# Patient Record
Sex: Female | Born: 1935 | Race: White | Hispanic: No | Marital: Married | Smoking: Never smoker
Health system: Southern US, Community
[De-identification: ages and names within clinical notes are randomized; demographics above are authoritative.]

## PROBLEM LIST (undated history)

## (undated) DIAGNOSIS — E039 Hypothyroidism, unspecified: Secondary | ICD-10-CM

## (undated) DIAGNOSIS — I10 Essential (primary) hypertension: Secondary | ICD-10-CM

## (undated) DIAGNOSIS — G4730 Sleep apnea, unspecified: Secondary | ICD-10-CM

## (undated) DIAGNOSIS — I4510 Unspecified right bundle-branch block: Secondary | ICD-10-CM

## (undated) DIAGNOSIS — M1990 Unspecified osteoarthritis, unspecified site: Secondary | ICD-10-CM

## (undated) DIAGNOSIS — Z955 Presence of coronary angioplasty implant and graft: Secondary | ICD-10-CM

## (undated) DIAGNOSIS — R0600 Dyspnea, unspecified: Secondary | ICD-10-CM

## (undated) DIAGNOSIS — I420 Dilated cardiomyopathy: Secondary | ICD-10-CM

## (undated) DIAGNOSIS — J90 Pleural effusion, not elsewhere classified: Secondary | ICD-10-CM

## (undated) DIAGNOSIS — R5383 Other fatigue: Secondary | ICD-10-CM

## (undated) DIAGNOSIS — I214 Non-ST elevation (NSTEMI) myocardial infarction: Secondary | ICD-10-CM

## (undated) DIAGNOSIS — I255 Ischemic cardiomyopathy: Secondary | ICD-10-CM

## (undated) DIAGNOSIS — I509 Heart failure, unspecified: Secondary | ICD-10-CM

## (undated) DIAGNOSIS — M797 Fibromyalgia: Secondary | ICD-10-CM

## (undated) DIAGNOSIS — R000 Tachycardia, unspecified: Secondary | ICD-10-CM

## (undated) DIAGNOSIS — R911 Solitary pulmonary nodule: Secondary | ICD-10-CM

## (undated) DIAGNOSIS — I2510 Atherosclerotic heart disease of native coronary artery without angina pectoris: Secondary | ICD-10-CM

## (undated) HISTORY — PX: APPENDECTOMY: SHX54

## (undated) HISTORY — DX: Other fatigue: R53.83

## (undated) HISTORY — DX: Heart failure, unspecified: I50.9

## (undated) HISTORY — PX: TONSILLECTOMY: SUR1361

## (undated) HISTORY — DX: Unspecified osteoarthritis, unspecified site: M19.90

## (undated) HISTORY — DX: Tachycardia, unspecified: R00.0

## (undated) HISTORY — DX: Dilated cardiomyopathy: I25.5

## (undated) HISTORY — DX: Pleural effusion, not elsewhere classified: J90

## (undated) HISTORY — DX: Hypothyroidism, unspecified: E03.9

## (undated) HISTORY — PX: OTHER SURGICAL HISTORY: SHX169

## (undated) HISTORY — DX: Non-ST elevation (NSTEMI) myocardial infarction: I21.4

## (undated) HISTORY — DX: Dyspnea, unspecified: R06.00

## (undated) HISTORY — DX: Solitary pulmonary nodule: R91.1

## (undated) HISTORY — DX: Atherosclerotic heart disease of native coronary artery without angina pectoris: I25.10

## (undated) HISTORY — DX: Essential (primary) hypertension: I10

## (undated) HISTORY — DX: Dilated cardiomyopathy: I42.0

## (undated) HISTORY — DX: Unspecified right bundle-branch block: I45.10

## (undated) HISTORY — DX: Sleep apnea, unspecified: G47.30

## (undated) HISTORY — DX: Presence of coronary angioplasty implant and graft: Z95.5

## (undated) HISTORY — DX: Fibromyalgia: M79.7

---

## 1997-11-11 ENCOUNTER — Other Ambulatory Visit: Admission: RE | Admit: 1997-11-11 | Discharge: 1997-11-11 | Payer: Self-pay | Admitting: Obstetrics & Gynecology

## 1999-11-15 ENCOUNTER — Other Ambulatory Visit: Admission: RE | Admit: 1999-11-15 | Discharge: 1999-11-15 | Payer: Self-pay | Admitting: Obstetrics & Gynecology

## 2000-12-10 ENCOUNTER — Other Ambulatory Visit: Admission: RE | Admit: 2000-12-10 | Discharge: 2000-12-10 | Payer: Self-pay | Admitting: Obstetrics & Gynecology

## 2003-12-10 ENCOUNTER — Other Ambulatory Visit: Admission: RE | Admit: 2003-12-10 | Discharge: 2003-12-10 | Payer: Self-pay | Admitting: Obstetrics & Gynecology

## 2003-12-21 ENCOUNTER — Ambulatory Visit (HOSPITAL_BASED_OUTPATIENT_CLINIC_OR_DEPARTMENT_OTHER): Admission: RE | Admit: 2003-12-21 | Discharge: 2003-12-21 | Payer: Self-pay | Admitting: Obstetrics & Gynecology

## 2004-01-06 ENCOUNTER — Ambulatory Visit: Payer: Self-pay | Admitting: Critical Care Medicine

## 2004-02-02 ENCOUNTER — Ambulatory Visit: Payer: Self-pay | Admitting: Critical Care Medicine

## 2004-04-12 ENCOUNTER — Encounter (INDEPENDENT_AMBULATORY_CARE_PROVIDER_SITE_OTHER): Payer: Self-pay | Admitting: *Deleted

## 2004-04-12 ENCOUNTER — Ambulatory Visit (HOSPITAL_COMMUNITY): Admission: RE | Admit: 2004-04-12 | Discharge: 2004-04-12 | Payer: Self-pay | Admitting: Gastroenterology

## 2004-05-23 ENCOUNTER — Ambulatory Visit: Payer: Self-pay | Admitting: Critical Care Medicine

## 2004-12-14 ENCOUNTER — Ambulatory Visit: Payer: Self-pay | Admitting: Critical Care Medicine

## 2005-10-31 ENCOUNTER — Ambulatory Visit: Payer: Self-pay | Admitting: Internal Medicine

## 2006-04-24 ENCOUNTER — Ambulatory Visit: Payer: Self-pay | Admitting: Internal Medicine

## 2006-04-26 ENCOUNTER — Encounter: Admission: RE | Admit: 2006-04-26 | Discharge: 2006-04-26 | Payer: Self-pay | Admitting: Obstetrics & Gynecology

## 2006-05-20 ENCOUNTER — Ambulatory Visit: Payer: Self-pay | Admitting: Internal Medicine

## 2006-12-11 ENCOUNTER — Ambulatory Visit: Payer: Self-pay | Admitting: Internal Medicine

## 2007-03-12 DIAGNOSIS — R911 Solitary pulmonary nodule: Secondary | ICD-10-CM

## 2007-03-12 DIAGNOSIS — J309 Allergic rhinitis, unspecified: Secondary | ICD-10-CM | POA: Insufficient documentation

## 2007-03-12 DIAGNOSIS — G4733 Obstructive sleep apnea (adult) (pediatric): Secondary | ICD-10-CM | POA: Insufficient documentation

## 2007-03-12 DIAGNOSIS — G2581 Restless legs syndrome: Secondary | ICD-10-CM

## 2007-03-12 DIAGNOSIS — IMO0001 Reserved for inherently not codable concepts without codable children: Secondary | ICD-10-CM

## 2007-03-12 DIAGNOSIS — E039 Hypothyroidism, unspecified: Secondary | ICD-10-CM | POA: Insufficient documentation

## 2007-03-14 ENCOUNTER — Encounter: Payer: Self-pay | Admitting: Internal Medicine

## 2007-04-30 ENCOUNTER — Ambulatory Visit: Payer: Self-pay | Admitting: Internal Medicine

## 2007-05-14 ENCOUNTER — Telehealth: Payer: Self-pay | Admitting: Internal Medicine

## 2008-05-04 ENCOUNTER — Ambulatory Visit: Payer: Self-pay | Admitting: Internal Medicine

## 2009-02-17 ENCOUNTER — Encounter: Payer: Self-pay | Admitting: Critical Care Medicine

## 2009-06-13 ENCOUNTER — Encounter: Payer: Self-pay | Admitting: Internal Medicine

## 2009-06-17 ENCOUNTER — Ambulatory Visit: Payer: Self-pay | Admitting: Internal Medicine

## 2009-06-21 ENCOUNTER — Telehealth (INDEPENDENT_AMBULATORY_CARE_PROVIDER_SITE_OTHER): Payer: Self-pay | Admitting: *Deleted

## 2009-06-21 LAB — CONVERTED CEMR LAB: TSH: 1.76 microintl units/mL (ref 0.35–5.50)

## 2009-06-22 ENCOUNTER — Encounter: Payer: Self-pay | Admitting: Internal Medicine

## 2009-06-23 ENCOUNTER — Telehealth: Payer: Self-pay | Admitting: Internal Medicine

## 2009-07-21 ENCOUNTER — Encounter: Payer: Self-pay | Admitting: Internal Medicine

## 2009-07-26 ENCOUNTER — Ambulatory Visit: Payer: Self-pay | Admitting: Internal Medicine

## 2009-08-25 ENCOUNTER — Encounter: Payer: Self-pay | Admitting: Internal Medicine

## 2009-12-09 ENCOUNTER — Ambulatory Visit: Payer: Self-pay | Admitting: Internal Medicine

## 2009-12-09 DIAGNOSIS — R002 Palpitations: Secondary | ICD-10-CM | POA: Insufficient documentation

## 2010-03-07 NOTE — Progress Notes (Signed)
Summary: lab results  Phone Note Call from Patient Call back at [phone]   Caller: Patient Call For: young Summary of Call: Wants lab results. Initial call taken by: Darletta Moll,  Jun 21, 2009 9:01 AM  Follow-up for Phone Call        Spoke with pt.  Informed her of lab results as stated in append from 06/19/09 by CY.  She verbalized understanding.  Gweneth Dimitri RN  Jun 21, 2009 9:27 AM

## 2010-03-07 NOTE — Assessment & Plan Note (Signed)
Summary: f/u ///kp   Primary Provider/Referring Provider:  Merlene Laughter  CC:  Pt c/o not being able to catch breath at night, current pressure on CPAP 12, and productive cough with thick yellow mucus.  History of Present Illness: Jun 17, 2009- OSA, hx lung nodule (Dr Pete Glatter follows CXR) She reports lung nodule remains stable on cxr f/u by Dr Pete Glatter. She says for several months she has been waking feeling smothered by CPAP that doesn't seem to work as well. She experiences this as the smothering she associates with the building where her mother was cremated- obviously negative connotation. Also more aware of palpitation especially on waking in the AM. We discussed hx of hypothyroid- not treated now.  July 26, 2009- OSA, hx lung nodule/ Dr Gaspar Bidding We increased CPAP to 11. Since then she has had humidifer and transformer changed. Full face mask. Dry mouth, some leak. She still feels she would like a little more pressure. Dislikes dry mouth. Asked about her lung nodule. Sometimes her lower anterior rib cage is sore. She is overall pleased to have CPAP and using it every night.  December 09, 2009- OSA, hx lung nodule/ Dr Gaspar Bidding Nurse-CC: Pt c/o not being able to catch breath at night, current pressure on CPAP 12, productive cough with thick yellow mucus She starts by saying " I am great".. then admits 2 episodes since August when she has gotten chilled, then sore throat, then persistent cough for a few weeks. First was 2-3 weeks ago. Then again in last 2 days the second episode is now associated with some thick yellow sputum. She feels she is getting over this w/o antibiotic.  Has had no problem with CPAP at 12. About twice/ week for some months she has awakened smothered, will take a deep breath and go  back to sleep. She sometimes senses CPAP flow might be not quite enough. Has felt palpitation at times.   Preventive Screening-Counseling & Management  Alcohol-Tobacco     Smoking  Status: never  Allergies: 1)  ! * Latex  Past History:  Past Medical History: Last updated: 05/19/2008 Allergic Rhinitis Sleep Apnea fibromyalgia hypothyroid hx of lung nodule- LLL Degenerative arthritis  Past Surgical History: Last updated: 2008-05-19 Tonsillectomy Appendectomy T A [facility] and B S [facility]  Family History: Last updated: 2008/05/19 Mother - died ovarian cnacer Father- died old age  Social History: Last updated: 05/19/08 Patient never smoked.  Worked Research officer, political party.  Risk Factors: Smoking Status: never (12/09/2009)  Review of Systems      See HPI       The patient complains of shortness of breath at rest and irregular heartbeats.  The patient denies shortness of breath with activity, productive cough, non-productive cough, coughing up blood, chest pain, acid heartburn, indigestion, loss of appetite, weight change, abdominal pain, difficulty swallowing, sore throat, tooth/dental problems, headaches, nasal congestion/difficulty breathing through nose, and sneezing.    Vital Signs:  Patient profile:   75 year old female Height:      66.5 inches Weight:      164.2 pounds BMI:     26.20 O2 Sat:      97 % on Room air Pulse rate:   83 / minute BP sitting:   114 / 80  (right arm) Cuff size:   regular  Vitals Entered By: Zackery Barefoot CMA (December 09, 2009 10:24 AM)  O2 Flow:  Room air CC: Pt c/o not being able to catch breath at night, current pressure on  CPAP 12, productive cough with thick yellow mucus Comments Medications reviewed with patient Verified contact number and pharmacy with patient Zackery Barefoot CMA  December 09, 2009 10:25 AM    Physical Exam  Additional Exam:  General: A/Ox3; pleasant and cooperative, NAD, wdwn, calm SKIN: no rash, lesions NODES: no lymphadenopathy HEENT: [place]/AT, EOM- WNL, Conjuctivae- clear, PERRLA, TM-WNL, Nose- clear, Throat- clear and wnl, some missing teeth, Mallampati  II-III NECK: Supple w/ fair ROM, JVD-  none, normal carotid impulses w/o bruits Thyroid- normal to palpation CHEST: Clear to P&A HEART: RRR, no m/g/r heard ABDOMEN: soft w/o HSM ZOX:WRUE, nl pulses, no edema  NEURO: Grossly intact to observation, no tremor      Impression & Recommendations:  Problem # 1:  SLEEP APNEA, OBSTRUCTIVE (ICD-327.23)  We are going to try moving her CPAP pressure up one step to 13 to see if that impacts her occasional sense of night time dyspnea. Her description is nonspecific but these episodes don't seem obviously related to her occasional palpitation, to fluid retention, wheeze or reflux that she has recognized.  She declines flu vax.   Problem # 2:  PALPITATIONS (ICD-785.1) Noted. Hx suggests this is chronic and not progressive.   Medications Added to Medication List This Visit: 1)  Cpap 13 [facility]  2)  Aspirin 81 Mg Tabs (Aspirin) .... Take 1 tablet by mouth once a day as needed  Other Orders: Est. Patient Level III (45409) DME Referral (DME)  Patient Instructions: 1)  Please schedule a follow-up appointment in 1 year. 2)  We will contact [facility] to move your pressure up to 13. If that isn't comfortable please call.  3)  If you keep having short of breath spells let us know.    Not Administered:    Pneumonia Vaccine not given due to: declined    Influenza Vaccine not given due to: declined

## 2010-03-07 NOTE — Miscellaneous (Signed)
Summary: CPAP Bi-Level Set Up Info/[facility]  CPAP Bi-Level Set Up Info/[facility]   Imported By: Sherian Rein 07/01/2009 13:31:09  _____________________________________________________________________  External Attachment:    Type:   Image     Comment:   External Document

## 2010-03-07 NOTE — Assessment & Plan Note (Signed)
Summary: 12 months/apc   Primary Provider/Referring Provider:  Merlene Laughter  CC:  Yearly follow up visit-OSA and allergies. Unsure if CPAP machine is working properly(over 75 years old);waking up at night and having suffication feelings..  History of Present Illness:  04/30/07- OSA, hx lung nodule She says Dr. Pete Glatter continues to follow her left lower lobe nodule with his last chest x-ray being done in November.  She still keeps CPAP set at 10  CWP every night.  Sometimes she feels palpitations at night, and she is going to talk to Dr. Pete Glatter about that.  She sees him today as scheduled.  She says her breathing feels shallow and, sometimes she'll cough but she doesn't notice wheeze, and she doesn't produce sputum.  There is little sinus drainage.  She is aware of reflux.  She takes naps sometimes being awakened by pain.  We talked about whether to increase her CPAP pressure little bit for trial.  Pulmonary function testing today shows high normal scores with some improvement in small airway flows after bronchodilator.  The FEV1/ FVC was 79%.  May 22, 2008- OSA, hx lung nodule ( Dr Pete Glatter follows CXR) Sleep- Waking more at night. Cites stress but also wakes dry mouth. Denies daytime sleepiness. Will sit and relax with eyes closed for a few minutes. If she naps without cpap she will wake snoring. Full face mask. Discussed mask fit.  Occasional  dry cough with cpap on.  Jun 17, 2009- OSA, hx lung nodule (Dr Pete Glatter follows CXR) She reports lung nodule remains stable on cxr f/u by Dr Pete Glatter. She says for several months she has been waking feeling smothered by CPAP that doesn't seem to work as well. She experiences this as the smothering she associates with the building where her mother was cremated- obviously negative connotation. Also more aware of palpitation especially on waking in the AM. We discussed hx of hypothyroid- not treated now.     Current Medications (verified): 1)  Vitamin  E 600 Unit  Caps (Vitamin E) .... Take 1 Capsule By Mouth Once A Day 2)  Calcium Carbonate-Vitamin D 600-400 Mg-Unit  Tabs (Calcium Carbonate-Vitamin D) .... Take 1 Tablet By Mouth Once A Day 3)  Oxytrol 3.9 Mg/24hr  Pttw (Oxybutynin) .... Use As Directed 4)  Cpap .... At 10 Cwp 5)  Vitamin D 2000 Unit Tabs (Cholecalciferol) .... Take 1 By Mouth Once Daily 6)  Fish Oil 1000 Mg Caps (Omega-3 Fatty Acids) .... Take 1 By Mouth Once Daily 7)  Garlic 1000 Mg Caps (Garlic) .... Take As Directed 8)  Red Yeast Rice 600 Mg Caps (Red Yeast Rice Extract) .... Take As Directed 9)  Flax Seed Oil 1000 Mg Caps (Flaxseed (Linseed)) .... Take As Directed 10)  Glucosamine Sulfate 1000 Mg Caps (Glucosamine Sulfate) .... Take 1 By Mouth Once Daily  Allergies (verified): 1)  ! * Latex  Past History:  Past Medical History: Last updated: 05/22/2008 Allergic Rhinitis Sleep Apnea fibromyalgia hypothyroid hx of lung nodule- LLL Degenerative arthritis  Past Surgical History: Last updated: 05-22-08 Tonsillectomy Appendectomy T A H and B S O  Family History: Last updated: May 22, 2008 Mother - died ovarian cnacer Father- died old age  Social History: Last updated: 05-22-2008 Patient never smoked.  Worked Research officer, political party.  Risk Factors: Smoking Status: never (04/30/2007)  Review of Systems      See HPI       The patient complains of irregular heartbeats.  The patient denies shortness of breath with activity, shortness of  breath at rest, productive cough, non-productive cough, coughing up blood, chest pain, acid heartburn, indigestion, loss of appetite, weight change, abdominal pain, difficulty swallowing, sore throat, tooth/dental problems, headaches, nasal congestion/difficulty breathing through nose, and sneezing.    Vital Signs:  Patient profile:   75 year old female Height:      66.5 inches Weight:      157 pounds BMI:     25.05 O2 Sat:      98 % on Room air Pulse rate:   81 / minute BP  sitting:   130 / 74  (left arm) Cuff size:   regular  Vitals Entered By: Reynaldo Minium CMA (Jun 17, 2009 11:07 AM)  O2 Flow:  Room air  Physical Exam  Additional Exam:  General: A/Ox3; pleasant and cooperative, NAD, wdwn, calm SKIN: no rash, lesions NODES: no lymphadenopathy HEENT: [place]/AT, EOM- WNL, Conjuctivae- clear, PERRLA, TM-WNL, Nose- clear, Throat- clear and wnl, some missing teeth, Mallampati  II-III NECK: Supple w/ fair ROM, JVD- none, normal carotid impulses w/o bruits Thyroid- normal to palpation CHEST: Clear to P&A HEART: RRR, no m/g/r heard ABDOMEN:  OAC:ZYSA, nl pulses, no edema  NEURO: Grossly intact to observation, no tremor      Impression & Recommendations:  Problem # 1:  SLEEP APNEA, OBSTRUCTIVE (ICD-327.23)  Her CPAP is wearing out and we will replace it at the same pressure initially. Compliance has been good.  Problem # 2:  HYPOTHYROIDISM (ICD-244.9)  Thyroid function may be relevant to her sleep. Pending return to her primary doctor in the Fall, we will check TFT.  Medications Added to Medication List This Visit: 1)  Cpap [facility]  .... At 10 cwp 2)  Glucosamine Sulfate 1000 Mg Caps (Glucosamine sulfate) .... Take 1 by mouth once daily  Other Orders: TLB-TSH (Thyroid Stimulating Hormone) (84443-TSH) Est. Patient Level III (63016) DME Referral (DME)  Patient Instructions: 1)  Please schedule a follow-up appointment in 1 year. 2)  See [facility] about replacing CPAP machine. We will leave the pressure at 10. If it isn't comfortable, then call and let me know. 3)  Lab to check on your thyroid. 4)  Patient: Tiffany Collier 5)  Note: All result statuses are Final unless otherwise noted........ 6)  Tests: (1) TSH (TSH) 7)    FastTSH                   1.76 uIU/mL                 0.35-5.50 8)  Note: An exclamation mark (!) indicates a result that was not dispersed into the flowsheet. 9)  Document Creation Date: 06/17/2009 1:53 PM 10)   _______________________________________________________________________ 11)  Keep your appointment with Dr Pete Glatter.

## 2010-03-07 NOTE — Assessment & Plan Note (Signed)
Summary: rov//mbw   Primary Provider/Referring Provider:  Merlene Laughter  CC:  rov followup from cpap.  History of Present Illness: 05/13/08- OSA, hx lung nodule ( Dr Pete Glatter follows CXR) Sleep- Waking more at night. Cites stress but also wakes dry mouth. Denies daytime sleepiness. Will sit and relax with eyes closed for a few minutes. If she naps without cpap she will wake snoring. Full face mask. Discussed mask fit.  Occasional  dry cough with cpap on.  Jun 17, 2009- OSA, hx lung nodule (Dr Pete Glatter follows CXR) She reports lung nodule remains stable on cxr f/u by Dr Pete Glatter. She says for several months she has been waking feeling smothered by CPAP that doesn't seem to work as well. She experiences this as the smothering she associates with the building where her mother was cremated- obviously negative connotation. Also more aware of palpitation especially on waking in the AM. We discussed hx of hypothyroid- not treated now.  July 26, 2009- OSA, hx lung nodule/ Dr Gaspar Bidding We increased CPAP to 11. Since then she has had humidifer and transformer changed. Full face mask. Dry mouth, some leak. She still feels she would like a little more pressure. Dislikes dry mouth. Asked about her lung nodule. Sometimes her lower anterior rib cage is sore. She is overall pleased to have CPAP and using it every night.     Preventive Screening-Counseling & Management  Alcohol-Tobacco     Smoking Status: never  Current Medications (verified): 1)  Vitamin E 600 Unit  Caps (Vitamin E) .... Take 1 Capsule By Mouth Once A Day 2)  Calcium Carbonate-Vitamin D 600-400 Mg-Unit  Tabs (Calcium Carbonate-Vitamin D) .... Take 1 Tablet By Mouth Once A Day 3)  Oxytrol 3.9 Mg/24hr  Pttw (Oxybutynin) .... Use As Directed 4)  Cpap 11 Adavnced 5)  Vitamin D 2000 Unit Tabs (Cholecalciferol) .... Take 1 By Mouth Once Daily 6)  Fish Oil 1000 Mg Caps (Omega-3 Fatty Acids) .... Take 1 By Mouth Once Daily 7)  Garlic  1000 Mg Caps (Garlic) .... Take As Directed 8)  Red Yeast Rice 600 Mg Caps (Red Yeast Rice Extract) .... Take As Directed 9)  Glucosamine Sulfate 1000 Mg Caps (Glucosamine Sulfate) .... Take 2 By Mouth Once Daily  Allergies: 1)  ! * Latex  Past History:  Past Medical History: Last updated: 05/13/2008 Allergic Rhinitis Sleep Apnea fibromyalgia hypothyroid hx of lung nodule- LLL Degenerative arthritis  Past Surgical History: Last updated: May 13, 2008 Tonsillectomy Appendectomy T A H and B S O  Family History: Last updated: 2008-05-13 Mother - died ovarian cnacer Father- died old age  Social History: Last updated: 05-13-08 Patient never smoked.  Worked Research officer, political party.  Risk Factors: Smoking Status: never (07/26/2009)  Review of Systems      See HPI  The patient denies shortness of breath with activity, shortness of breath at rest, productive cough, non-productive cough, coughing up blood, chest pain, irregular heartbeats, acid heartburn, indigestion, loss of appetite, weight change, abdominal pain, difficulty swallowing, sore throat, tooth/dental problems, headaches, nasal congestion/difficulty breathing through nose, and sneezing.    Vital Signs:  Patient profile:   75 year old female Height:      66.5 inches Weight:      157 pounds BMI:     25.05 O2 Sat:      98 % on Room air Pulse rate:   83 / minute BP sitting:   128 / 76  (left arm) Cuff size:   regular  Vitals Entered  By: Kandice Hams (July 26, 2009 1:46 PM)  O2 Flow:  Room air  Physical Exam  Additional Exam:  General: A/Ox3; pleasant and cooperative, NAD, wdwn, calm SKIN: no rash, lesions NODES: no lymphadenopathy HEENT: [place]/AT, EOM- WNL, Conjuctivae- clear, PERRLA, TM-WNL, Nose- clear, Throat- clear and wnl, some missing teeth, Mallampati  II-III NECK: Supple w/ fair ROM, JVD- none, normal carotid impulses w/o bruits Thyroid- normal to palpation CHEST: Clear to P&A HEART: RRR, no m/g/r  heard ABDOMEN: soft w/o HSM UXL:KGMW, nl pulses, no edema  NEURO: Grossly intact to observation, no tremor      Impression & Recommendations:  Problem # 1:  SLEEP APNEA, OBSTRUCTIVE (ICD-327.23)  She is compliant with CPAP and happy to have it, but still running in to some comfort issues especially related to the humidifier. We will direct her to work with the DME company and try [employer]. We will also increase the presure to 12.  Problem # 2:  LUNG NODULE (ICD-518.89)  I reassured her that, as long as she was being followed, and the CXR was stable, various shifting aches and pains are unlikely to be related to this nodule.  Problem # 3:  RESTLESS LEGS SYNDROME (ICD-333.94) She is less aware of limb movement. CPAP is still a novelty to her, and distracting. Some of the movement may have been respiratory arousals which should improve with CPAP.  Medications Added to Medication List This Visit: 1)  Cpap 12 Adavnced  2)  Glucosamine Sulfate 1000 Mg Caps (Glucosamine sulfate) .... Take 2 by mouth once daily  Other Orders: Est. Patient Level III (10272) DME Referral (DME)  Patient Instructions: 1)  Please schedule a follow-up appointment in 4 months. 2)  We will have the CPAP pressure increased to 12. If you aren't comfortable, please let me know.

## 2010-03-07 NOTE — Letter (Signed)
Summary: CPAP 7 supplies/[facility]  CPAP 7 supplies/[facility]   Imported By: Lester Wellington 02/21/2009 08:35:43  _____________________________________________________________________  External Attachment:    Type:   Image     Comment:   External Document

## 2010-03-07 NOTE — Progress Notes (Signed)
Summary: raise pressure  Phone Note Call from Patient Call back at [phone]   Caller: Patient Call For: Burna Atlas Reason for Call: Talk to Nurse Summary of Call: [facility] brought her new cpap machine.  She said it is set on 10, but that isn't strong enough.  Can you ok a raise in preasure.  She says she doesn't get enough air. Initial call taken by: Eugene Gavia,  Jun 23, 2009 9:07 AM  Follow-up for Phone Call        Pt states her cpap is set on 10 and she feels like she is not getting enough air. She states she wakes up several times during the night feeling like she is suffocating. Please advise. Carron Curie CMA  Jun 23, 2009 9:56 AM   Additional Follow-up for Phone Call Additional follow up Details #1::        We will increase to 11. I have oredered. Let her know and ask she call again if this isn't comfortable, in which case I will do an autotitration. Additional Follow-up by: Waymon Budge MD,  Jun 23, 2009 1:41 PM    Additional Follow-up for Phone Call Additional follow up Details #2::    LMOMTCB.Michel Bickers CMA  Jun 23, 2009 1:54 PM  pt advsied. Carron Curie CMA  Jun 23, 2009 2:07 PM   New/Updated Medications: * CPAP 11 ADAVNCED

## 2010-03-07 NOTE — Letter (Signed)
Summary: CMN for CPAP Supplies/[facility]  CMN for CPAP Supplies/[facility]   Imported By: Sherian Rein 07/14/2009 11:28:19  _____________________________________________________________________  External Attachment:    Type:   Image     Comment:   External Document

## 2010-04-19 ENCOUNTER — Telehealth: Payer: Self-pay | Admitting: Internal Medicine

## 2010-04-25 NOTE — Progress Notes (Signed)
Summary: xray order-  Phone Note Call from Patient Call back at Home Phone [phone] Call back at [phone]   Caller: Patient Call For: young Reason for Call: Talk to Nurse Summary of Call: Patient calling stating that she is due for a xray.  Patient requesting order to be on 04/06.   Initial call taken by: Lehman Prom,  April 19, 2010 9:22 AM  Follow-up for Phone Call        [facility]. Looks like Dr. Pete Glatter has been following lung nodules in the past and ordering CXR.Michel Bickers [facility]  April 19, 2010 9:28 AM  lmomtcb x2 Carver Fila  April 19, 2010 9:39 AM   Pt states she has not had repeat CXR and would like to have this done for CDY on 05/15/2010 to keep check on pulmonary nodule. Pls advise. Follow-up by: Michel Bickers CMA,  April 19, 2010 3:34 PM  Additional Follow-up for Phone Call Additional follow up Details #1::        Dr Pete Glatter has been following her CXR through [facility].  She is asking that we do it, so I will put in order for CXR to be done 05/12/10 as she requests.  Additional Follow-up by: Waymon Budge MD,  April 19, 2010 4:36 PM    Additional Follow-up for Phone Call Additional follow up Details #2::    ATC home number-mailbox full; unable to leave a message; called cell number and LMTCB.Reynaldo Minium CMA  April 19, 2010 4:56 PM   Spoke with pt and she requsts that the cxr be changed to the 05-09-10. Order changed.Carron Curie CMA  April 20, 2010 9:13 AM

## 2010-05-09 ENCOUNTER — Ambulatory Visit (INDEPENDENT_AMBULATORY_CARE_PROVIDER_SITE_OTHER)
Admission: RE | Admit: 2010-05-09 | Discharge: 2010-05-09 | Disposition: A | Discharge status: Home / Self Care | Payer: Medicare Other | Source: Ambulatory Visit | Attending: Internal Medicine | Admitting: Internal Medicine

## 2010-05-09 ENCOUNTER — Other Ambulatory Visit: Payer: Self-pay | Admitting: Internal Medicine

## 2010-05-09 ENCOUNTER — Other Ambulatory Visit: Payer: Self-pay | Admitting: Dermatology

## 2010-05-09 DIAGNOSIS — R911 Solitary pulmonary nodule: Secondary | ICD-10-CM

## 2010-05-09 DIAGNOSIS — J984 Other disorders of lung: Secondary | ICD-10-CM

## 2010-05-15 NOTE — Progress Notes (Signed)
Quick Note:  ATC patient-no answer and unable to leave message as answering machine was off. Will attempt to call later. ______

## 2010-05-18 ENCOUNTER — Telehealth: Payer: Self-pay | Admitting: Internal Medicine

## 2010-05-18 NOTE — Telephone Encounter (Signed)
CXR- No new process. Old nodule is unchanged since 2008.  Advised pt of cxr results and pt verbalized understanding and nothing further was needed

## 2010-06-16 ENCOUNTER — Ambulatory Visit: Payer: Self-pay | Admitting: Internal Medicine

## 2010-06-20 NOTE — Assessment & Plan Note (Signed)
[facility]                             PULMONARY OFFICE NOTE   LETRICIA, KRINSKY                        MRN:          [id number]  DATE:12/11/2006                            DOB:          Jun 13, 1935    PROBLEMS:  1. Obstructive sleep apnea.  2. Fibromyalgia.  3. Allergic rhinitis.  4. Restless leg.  5. Lung nodule.   HISTORY:  She discussed chest x-ray and left lower lobe nodule, which is  being followed by Dr. Pete Glatter. He did a chest x-ray in October and  will follow up. She continues CPAP at 10 CWP. She notices some chest  tightness and wheeze occasionally. Coughing at night more than daytime.  Palpitations at night occasionally wake her. She complains that nose  burns. She feels sore in the upper abdomen bilaterally just beneath the  anterior costal margin.   MEDICATIONS:  Listed and reviewed. Significant for CPAP at 10.   Drug intolerant LATEX.   OBJECTIVE:  Weight 157 pounds, blood pressure 134/78, pulse 71, room air  saturation 100%. She may be minimally tender under the ribs where  indicated. I cannot feel liver or spleen. Bowel sounds are present.  Heart sounds are regular without murmur. Lung fields sound clear. There  is no rub. No rales or wheeze. Nasal airway is clear. Palette spacing  3/4.   IMPRESSION:  1. Obstructive sleep apnea is controlled on CPAP.  2. Bronchitis and rhinitis may reflect a seasonal pollen response,      weather change, or other factors. It has been going on a little      long to be considered a viral URI. Her tenderness/soreness is      pretty non-specific and I do not see much to go with it just now.   PLAN:  1. Dr. Pete Glatter is following her lung nodule.  2. Question of whether Dr. Pete Glatter would want her to wear a Holter      monitor for her complaint of recurrent palpitations.  3. Schedule PFT.  4. Try sample Proventil HFA 2 puffs q.i.d. p.r.n.  5. Sample Veramyst nasal spray once in each  nostril daily.  6. Schedule 3 months, earlier p.r.n.     Clinton D. Maple Hudson, MD, Tonny Bollman, FACP  Electronically Signed    CDY/MedQ  DD: 12/14/2006  DT: 12/15/2006  Job #: [id number]   cc:   Hal T. Stoneking, M.D.

## 2010-06-23 NOTE — Assessment & Plan Note (Signed)
[facility]                             PULMONARY OFFICE NOTE   Tiffany Collier, Tiffany Collier                        MRN:          [id number]  DATE:04/24/2006                            DOB:          September 16, 1935    PROBLEM:  1. Obstructive sleep apnea.  2. Fibromyalgia.  3. Allergic rhinitis.  4. Restless leg.  5. Lung nodule.   HISTORY:  She brings a chest x-ray from [facility] dated  December 13, 2004 showing what may be a nodule in the periphery of the  left lung versus rib tip, and she says she is due to follow up there  with Dr. Pete Glatter today for repeat.  She has felt some tenderness touch  in the left anterior axillary line, and has had a little more cough than  usual.  She questions seasonal pollens, saying that she had allergies  with positive allergy skin testing years ago by Dr. Andria Meuse, and  bronchitis as a child.  Nose runs, especially with strong smells.  She  is using her CPAP now at 10 CWP.  Still some dry throat, but daytime  sleepiness is much better, and the mask fits comfortably.  She complains  about her known restless leg.   MEDICATIONS:  1. Omega 3.  2. Red yeast rice.  3. Calcium.  4. CPAP at 10 CWP.  5. Glucosamine.  6. Vitamin E.  7. Alfalfa.   DRUG INTOLERANT OF LATEX.   OBJECTIVE:  Weight 159 pounds.  BP 136/90.  Pulse regular at 75.  Room  air saturation 97%.  I do not find adenopathy.  LUNGS:  Sound clear.  Nasal airway is clear with no postnasal drainage.  HEART:  Sounds are regular without murmur.   IMPRESSION:  Sleep apnea seems adequately controlled, although she is  going to work with the home care company on her humidifier, and use  nasal saline.  I also told her about Salivart, which may help give her a  wetter sensation in her mouth.  She will continue her pressure at 10  CWP.  I have emphasized reflux precautions.  To follow up with Dr.  Pete Glatter on the chest x-  ray.  She is given samples of  Requip with a starter pack 0.25 mg, and  discussion.  Schedule return in 6 months, but earlier p.r.n.     Clinton D. Maple Hudson, MD, Tonny Bollman, FACP  Electronically Signed    CDY/MedQ  DD: 04/27/2006  DT: 04/27/2006  Job #: [id number]   cc:   Hal T. Stoneking, M.D.

## 2010-06-23 NOTE — Op Note (Signed)
[name], Tiffany Collier               ACCOUNT NO.:  [id number]   MEDICAL RECORD NO.:  [id number]          PATIENT TYPE:  AMB   LOCATION:  ENDO                         FACILITY:  [facility]   PHYSICIAN:  Danise Edge, M.D.   DATE OF BIRTH:  January 13, 1936   DATE OF PROCEDURE:  04/12/2004  DATE OF DISCHARGE:                                 OPERATIVE REPORT   PROCEDURE:  Screening colonoscopy.   PROCEDURE INDICATIONS:  [name] is a 75 year old female born [birth year].  [name] is scheduled to undergo her first screening  colonoscopy with polypectomy to prevent colon cancer.   ENDOSCOPIST:  Danise Edge, M.D.   PREMEDICATION:  Versed 5 mg, Demerol 50 mg.   PROCEDURE:  After obtaining informed consent, [name] was placed in the  left lateral decubitus position.  I administered intravenous Demerol and  intravenous Versed to achieve conscious sedation for the procedure.  The  patient's blood pressure, oxygen saturation and cardiac rhythm were  monitored throughout the procedure and documented in the medical record.   Anal inspection and digital rectal exam were normal.  The Olympus adjustable  pediatric colonoscope was introduced into the rectum and advanced to the  cecum.  Colonic preparation for the exam today was excellent.   Rectum normal.  Sigmoid colon and descending colon normal.  Splenic flexure normal.  Transverse colon:  From the distal transverse colon, a 1 mm sessile polyp  was removed with cold biopsy forceps.  Hepatic flexure normal.  Ascending colon:  From the distal descending colon, a 1 mm sessile polyp was  removed with cold biopsy forceps.  Cecum and ileocecal valve normal.   ASSESSMENT:  A diminutive polyp was removed from the distal ascending colon  and a diminutive polyp was removed from the distal transverse colon.   RECOMMENDATIONS:  Repeat colonoscopy in 5 years if polyps return neoplastic  pathologically.      MJ/MEDQ  D:  04/12/2004  T:   04/12/2004  Job:  [id number]   cc:   Hal T. Stoneking, M.D.  [address] [place], [place] [address]  Fax: [phone]

## 2010-06-23 NOTE — Assessment & Plan Note (Signed)
[facility]                               PULMONARY OFFICE NOTE   Tiffany Collier, Tiffany Collier                        MRN:          [id number]  DATE:10/31/2005                            DOB:          11-17-1935    PROBLEM:  New sleep medicine patient seen for help with continuous positive  airway pressure.  She says she was referred by Dr. Danise Mina who had last  seen her in November of 2006 when she was wearing continuous positive airway  pressure at 9 centimeters of water pressure.  She had nocturnal  polysomnogram on December 21, 2003 demonstrating moderately severe  obstructive sleep apnea/hypopnea syndrome with an apnea/hypopnea index of 31  per hour, desaturating to 87%.  Continuous positive airway pressure was  titrated to 9 for an apnea/hypopnea index of 0 per hour.  She had a few leg  jerks, probably not significant.  She says she started with continuous  positive airway pressure and at first had a problem with biting her tongue  which has not continued.  [facility] had fitted her with a full-  face mask, heated humidifier at a pressure of 9.  She says the mask now  leaks.  Occasionally, she complains of dry mouth, and it is taking her  longer to fall asleep.  She and her husband are in separate bed rooms  because her husband is a loud snorer.  She states she feels resting, taking  an occasional nap.  Rarely she wakes feeling smothered.  She thinks she  jerks or twitches just at sleep onset but is not being told that this is an  obvious pattern routinely throughout sleep.  Bedtime is between 9 and 10:30  p.m., estimating 10 minutes to fall asleep, recently longer.  She wakes  many times during the night before getting up sometime between 6 a.m. and  9:30 a.m.   MEDICATIONS:  1. Mineral and herbal supplements.  2. Calcium.   ALLERGIES:  DRUG INTOLERANCE TO LATEX.   REVIEW OF SYSTEMS:  Weight varies up and down around 10 pounds.   Occasional  cough.  Irritant odors bother her a little bit.  Occasional pains in the  right lateral chest which do not seem to fit any particular pattern.  Feet  tingle.  She denies shortness of breath or routine phlegm, heartburn,  palpitation, exertional chest pain.   PAST MEDICAL HISTORY:  1. She was treated for hypothyroidism in the past.  2. Fibromyalgia.  3. Was treated in the past with amitriptyline.  4. Dr. Pete Glatter follows her chest x-ray for something in my left lung.  5. She was on allergy vaccine for awhile as a child and notices some mild      seasonal nasal congestion now.  6. No history of other cardiopulmonary or central nervous system disorder.  7. Hysterectomy in 1990.  8. Appendectomy.  9. Tonsillectomy.  10.Grandmother had tuberculosis but apparently there was not much      exposure, and PPD skin tests were negative in the past.   SOCIAL HISTORY:  Never smoked.  Little caffeine.  Married with 3 children.   FAMILY HISTORY:  Grandmother had emphysema.  Mother had ovarian cancer.   OBJECTIVE:  VITAL SIGNS:  Weight 154 pounds.  Blood pressure 122/80, pulse  regular at 86, room air saturation 96%.  GENERAL:  This is an alert woman, medium body build, no evident distress.  She is not obviously sleepy.  SKIN:  No rash.  ADENOPATHY:  None found.  HEENT:  Left nostril relatively narrow.  There may be some mild septal  deviation.  No polyps or significant mucus.  Oropharynx is clear with a  relatively long palate, 3/4.  Voice quality is normal with no stridor or  thyromegaly.  No neck vein distension.  HEART:  Heart sounds are regular without murmur or gallop.  LUNGS:  Clear to P&A.  EXTREMITIES:  There is no restlessness or tremor.   IMPRESSION:  Obstructive sleep apnea by history, now with mask leak and a  sense that she is getting incomplete control.   PLAN:  1. After discussing the various options, we are going to ask [facility] to reduce  CPAP to 8 CWP, check mask fit, and consider C-      Flex.  2. Return in 1 month for followup.  Depending on her response to initial      CPAP adjustment, we will make decisions about further testing,      titration or adjustment, and reevaluation of her needs.       Clinton D. Maple Hudson, MD, FCCP, FACP      CDY/MedQ  DD:  11/01/2005  DT:  11/03/2005  Job #:  [id number]   cc:   Hal T. Pete Glatter, M.D.  Freddy Finner, M.D.

## 2010-06-23 NOTE — Procedures (Signed)
NAMEDENITA, LUN               ACCOUNT NO.:  [id number]   MEDICAL RECORD NO.:  [id number]          PATIENT TYPE:  OUT   LOCATION:  SLEEP CENTER                 FACILITY:  [facility]   PHYSICIAN:  Clinton D. Maple Hudson, M.D. DATE OF BIRTH:  1935-05-12   DATE OF STUDY:  12/21/2003                              NOCTURNAL POLYSOMNOGRAM   REFERRING PHYSICIAN:  Freddy Finner, M.D.   INDICATION FOR STUDY AND HISTORY:  Hypersomnia with sleep apnea.   Epworth sleepiness score 9/24, neck size 13 inches, BMI 25, weight 153  pounds.   SLEEP ARCHITECTURE:  Total sleep time 317 minutes with sleep efficiency of  78%.  Stage I was 21%; stage II, 42%; Stages III and IV 22%.  REM was 15% of  total sleep time.  Latency to sleep onset 4 minutes, latency to REM 127  minutes.  Awake after sleep onset 87 minutes.  Arousal index 28 which is  mildly increased.   RESPIRATORY DATA:  Split study protocol.  RDI 31 per hour indicating  moderately severe obstructive sleep apnea/hypopnea syndrome before CPAP.  This included 31 obstructive apneas and 60 hypopneas before CPAP.  Events  were not positional.  REM RDI 26.9 per hour.  CPAP was titrated to 9 CWP  using a Respironics small Comfort Gel nasal mask with heated humidifier  which was fairly well tolerated.   OXYGEN DATA:  Moderate to occasionally loud snoring with oxygen desaturation  to a nadir of 87% before CPAP.  After CPAP titration, saturation held at 96  to 98% on room air.   CARDIAC DATA:  Sinus rhythm with occasional PVC.   MOVEMENT AND PARASOMNIA:  Bathroom x 2.  A total of 151 limb jerks were  recorded of which 16 were associated with arousal or awakening for a  periodic limb movement with arousal index of 3 per hour which is slightly  increased.   IMPRESSION AND RECOMMENDATIONS:  1.  Moderately severe obstructive sleep apnea/hypopnea syndrome, RDI 31 per      hour with desaturation to 87%.  CPAP titration to 9 CWP, RDI 0 per hour,      using a  small Respironics Comfort Gel nasal mask with heated humidifier.  2.  Mild periodic limb movement syndrome with arousal, 3 per hour.   Clinton D. Maple Hudson, M.D.  Diplomate, Biomedical engineer of Sleep Medicine                                                           Clinton D. Maple Hudson, M.D.  Diplomate, [employer] Board   CDY/MEDQ  D:  12/26/2003 10:10:37  T:  12/26/2003 10:27:08  Job:  [id number]   cc:   Freddy Finner, M.D.  [address] [place]  [place] [address]  Fax: [phone]

## 2010-12-08 ENCOUNTER — Encounter: Payer: Self-pay | Admitting: Internal Medicine

## 2010-12-08 ENCOUNTER — Ambulatory Visit (INDEPENDENT_AMBULATORY_CARE_PROVIDER_SITE_OTHER): Payer: Medicare Other | Admitting: Internal Medicine

## 2010-12-08 VITALS — BP 138/80 | HR 82 | Ht 66.5 in | Wt 165.2 lb

## 2010-12-08 DIAGNOSIS — J984 Other disorders of lung: Secondary | ICD-10-CM

## 2010-12-08 DIAGNOSIS — G4733 Obstructive sleep apnea (adult) (pediatric): Secondary | ICD-10-CM

## 2010-12-08 DIAGNOSIS — Z77090 Contact with and (suspected) exposure to asbestos: Secondary | ICD-10-CM

## 2010-12-08 NOTE — Progress Notes (Signed)
12/08/10- 30F never smoker followed for OSA. Dr Pete Glatter follows a lung nodule. Hypothyroid, Restless legs, allergic rhinitis. LOV-12/09/09 She feels well. She can tell with CPAP helps her and feels it is set correctly at 13. She is occasionally awakened feeling smothered, take a deep breath and gone back to sleep. She asked for a discussion of mouthpieces to treat sleep apnea. I reviewed these and answered her questions. 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  ROS-see HPI Constitutional:   No-   weight loss, night sweats, fevers, chills, fatigue, lassitude. HEENT:   No-  headaches, difficulty swallowing, tooth/dental problems, sore throat,       No-  sneezing, itching, ear ache, nasal congestion, post nasal drip,  CV:  No-   chest pain, orthopnea, PND, swelling in lower extremities, anasarca, dizziness, palpitations Resp: No-   shortness of breath with exertion or at rest.              No-   productive cough,  No non-productive cough,  No- coughing up of blood.              No-   change in color of mucus.  No- wheezing.   Skin: No-   rash or lesions. GI:  No-   heartburn, indigestion, abdominal pain, nausea, vomiting, diarrhea,                 change in bowel habits, loss of appetite GU: No-   dysuria, change in color of urine, no urgency or frequency.  No- flank pain. MS:  No-   joint pain or swelling.  No- decreased range of motion.  No- back pain. Neuro-     nothing unusual Psych:  No- change in mood or affect. No depression or anxiety.  No memory loss.  OBJ General- Alert, Oriented, Affect-appropriate, Distress- none acute Skin- rash-none, lesions- none, excoriation- none Lymphadenopathy- none Head- atraumatic            Eyes- Gross vision intact, PERRLA, conjunctivae clear secretions            Ears- Hearing, canals-normal            Nose- Clear, no-Septal dev, mucus, polyps,  erosion, perforation             Throat- Mallampati II , mucosa clear , drainage- none, tonsils- atrophic Neck- flexible , trachea midline, no stridor , thyroid nl, carotid no bruit Chest - symmetrical excursion , unlabored           Heart/CV- RRR , no murmur , no gallop  , no rub, nl s1 s2                           - JVD- none , edema- none, stasis changes- none, varices- none           Lung- clear to P&A, wheeze- none, cough- none , dullness-none, rub- none. I do not hear crackles.           Chest wall-  Abd- tender-no, distended-no, bowel sounds-present, HSM- no Br/ Gen/ Rectal- Not done, not indicated Extrem- cyanosis- none, clubbing, none, atrophy- none, strength- nl Neuro- grossly intact to observation

## 2010-12-08 NOTE — Patient Instructions (Signed)
We will continue CPAP at 13  Please call as needed

## 2010-12-09 DIAGNOSIS — Z77090 Contact with and (suspected) exposure to asbestos: Secondary | ICD-10-CM | POA: Insufficient documentation

## 2010-12-09 NOTE — Assessment & Plan Note (Signed)
She does not have recognized asbestosis or related disorders but should be watched with her history In mind.

## 2010-12-09 NOTE — Assessment & Plan Note (Signed)
From available documentation her nodule has not changed since 2008 and is almost certainly benign. I explained that a significant history of asbestos exposure and tobacco he exposure identifies risk of lung cancer so she should continue to be watched his chest x-ray every year or so.

## 2010-12-09 NOTE — Assessment & Plan Note (Signed)
Good compliance and control at 13 CWP

## 2010-12-11 ENCOUNTER — Other Ambulatory Visit: Payer: Self-pay | Admitting: Dermatology

## 2011-12-10 ENCOUNTER — Ambulatory Visit: Payer: Medicare Other | Admitting: Internal Medicine

## 2011-12-18 ENCOUNTER — Encounter: Payer: Self-pay | Admitting: Internal Medicine

## 2011-12-18 ENCOUNTER — Ambulatory Visit (INDEPENDENT_AMBULATORY_CARE_PROVIDER_SITE_OTHER): Payer: Medicare Other | Admitting: Internal Medicine

## 2011-12-18 VITALS — BP 136/84 | HR 81 | Ht 66.5 in | Wt 161.8 lb

## 2011-12-18 DIAGNOSIS — G4733 Obstructive sleep apnea (adult) (pediatric): Secondary | ICD-10-CM

## 2011-12-18 DIAGNOSIS — Z77090 Contact with and (suspected) exposure to asbestos: Secondary | ICD-10-CM

## 2011-12-18 DIAGNOSIS — J984 Other disorders of lung: Secondary | ICD-10-CM

## 2011-12-18 NOTE — Progress Notes (Signed)
12/08/10- 89F never smoker followed for OSA. Dr Pete Glatter follows a lung nodule. Hypothyroid, Restless legs, allergic rhinitis. LOV-12/09/09 She feels well. She can tell with CPAP helps her and feels it is set correctly at 13. She is occasionally awakened feeling smothered, take a deep breath and gone back to sleep. She asked for a discussion of mouthpieces to treat sleep apnea. I reviewed these and answered her questions. 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  12/18/11- 69F never smoker followed for OSA. Dr Pete Glatter follows a lung nodule. Hypothyroid, Restless legs, allergic rhinitis Wears CPAP 13/ [facility] every night for about 7-8 hours and pressure working  well for patient; not due for supplies at this time. Minor occasional cough. Declines flu vaccine CXR 05/18/10 IMPRESSION:  1. No acute cardiopulmonary abnormality.  2. Stable benign left lateral lung nodule (unchanged since 2008).  Original Report Authenticated By: Harley Hallmark, M.D.   ROS-see HPI Constitutional:   No-   weight loss, night sweats, fevers, chills, fatigue, lassitude. HEENT:   No-  headaches, difficulty swallowing, tooth/dental problems, sore throat,       No-  sneezing, itching, ear ache, nasal congestion, post nasal drip,  CV:  No-   chest pain, orthopnea, PND, swelling in lower extremities, anasarca, dizziness, palpitations Resp: No-   shortness of breath with exertion or at rest.              No-   productive cough,  + non-productive cough,  No- coughing up of blood.              No-   change in color of mucus.  No- wheezing.   Skin: No-   rash or lesions. GI:  No-   heartburn, indigestion, abdominal pain, nausea, vomiting, GU:  MS:  No-   joint pain or swelling.   Neuro-     nothing unusual Psych:  No- change in mood or affect. No depression or anxiety.  No memory loss.  OBJ General- Alert, Oriented,  Affect-appropriate, Distress- none acute Skin- rash-none, lesions- none, excoriation- none Lymphadenopathy- none Head- atraumatic            Eyes- Gross vision intact, PERRLA, conjunctivae clear secretions            Ears- Hearing, canals-normal            Nose- Clear, no-Septal dev, mucus, polyps, erosion, perforation             Throat- Mallampati II , mucosa clear , drainage- none, tonsils- atrophic Neck- flexible , trachea midline, no stridor , thyroid nl, carotid no bruit Chest - symmetrical excursion , unlabored           Heart/CV- RRR , no murmur , no gallop  , no rub, nl s1 s2                           - JVD- none , edema- none, stasis changes- none, varices- none           Lung- clear to P&A, wheeze- none, cough- none , dullness-none, rub- none. I do not hear crackles.           Chest wall-  Abd-  Br/ Gen/ Rectal- Not done, not indicated Extrem- cyanosis- none, clubbing, none, atrophy- none, strength- nl Neuro- grossly intact to observation

## 2011-12-18 NOTE — Patient Instructions (Addendum)
We can continue CPAP [facility]  Please call as needed

## 2011-12-27 NOTE — Assessment & Plan Note (Signed)
Good compliance and control. Life is better off with CPAP

## 2011-12-27 NOTE — Assessment & Plan Note (Signed)
Discussed medical concerns. Follow with occasional imaging as appropriate.

## 2012-12-18 ENCOUNTER — Ambulatory Visit: Payer: Medicare Other | Admitting: Internal Medicine

## 2013-05-11 ENCOUNTER — Ambulatory Visit: Payer: Medicare Other | Admitting: Internal Medicine

## 2013-12-02 ENCOUNTER — Encounter: Payer: Self-pay | Admitting: Internal Medicine

## 2013-12-02 ENCOUNTER — Ambulatory Visit (INDEPENDENT_AMBULATORY_CARE_PROVIDER_SITE_OTHER): Payer: Medicare Other | Admitting: Internal Medicine

## 2013-12-02 ENCOUNTER — Encounter (INDEPENDENT_AMBULATORY_CARE_PROVIDER_SITE_OTHER): Payer: Self-pay

## 2013-12-02 VITALS — BP 114/72 | HR 79 | Ht 66.0 in | Wt 154.2 lb

## 2013-12-02 DIAGNOSIS — G4733 Obstructive sleep apnea (adult) (pediatric): Secondary | ICD-10-CM

## 2013-12-02 NOTE — Progress Notes (Signed)
12/08/10- 23F never smoker followed for OSA. Dr Pete GlatterStoneking follows a lung nodule. Hypothyroid, Restless legs, allergic rhinitis. LOV-12/09/09 She feels well. She can tell with CPAP helps her and feels it is set correctly at 13. She is occasionally awakened feeling smothered, take a deep breath and gone back to sleep. She asked for a discussion of mouthpieces to treat sleep apnea. I reviewed these and answered her questions. 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  12/18/11- 73F never smoker followed for OSA. Dr Pete GlatterStoneking follows a lung nodule. Hypothyroid, Restless legs, allergic rhinitis Wears CPAP 13/ [facility] every night for about 7-8 hours and pressure working  well for patient; not due for supplies at this time. Minor occasional cough. Declines flu vaccine CXR 05/18/10 IMPRESSION:  1. No acute cardiopulmonary abnormality.  2. Stable benign left lateral lung nodule (unchanged since 2008).  Original Report Authenticated By: Harley HallmarkH.LEE HALL III, M.D.   12/02/13- 4F never smoker followed for OSA. Dr Pete GlatterStoneking follows a lung nodule. Hypothyroid, Restless legs, allergic rhinitis CPAP 13/ [facility] FOLLOW FOR: Sleep Apnea; CPAP 13cm; Wears every night, approx 8 hours nightly; cough, fatigue x 4 days, dx: bronchitis by Dr. Argie Rammingann She declines flu and pneumonia vaccines. Recent acute bronchitis treated by her primary physician with cough syrup, Augmentin, steroid shot.  ROS-see HPI Constitutional:   No-   weight loss, night sweats, fevers, chills, fatigue, lassitude. HEENT:   No-  headaches, difficulty swallowing, tooth/dental problems, sore throat,       No-  sneezing, itching, ear ache, nasal congestion, post nasal drip,  CV:  No-   chest pain, orthopnea, PND, swelling in lower extremities, anasarca, dizziness, palpitations Resp: No-   shortness of breath with exertion or at rest.              No-    productive cough,  + non-productive cough,  No- coughing up of blood.              No-   change in color of mucus.  No- wheezing.   Skin: No-   rash or lesions. GI:  No-   heartburn, indigestion, abdominal pain, nausea, vomiting, GU:  MS:  No-   joint pain or swelling.   Neuro-     nothing unusual Psych:  No- change in mood or affect. No depression or anxiety.  No memory loss.  OBJ General- Alert, Oriented, Affect-appropriate, Distress- none acute Skin- rash-none, lesions- none, excoriation- none Lymphadenopathy- none Head- atraumatic            Eyes- Gross vision intact, PERRLA, conjunctivae clear secretions            Ears- Hearing, canals-normal            Nose- Clear, no-Septal dev, mucus, polyps, erosion, perforation             Throat- Mallampati II , mucosa clear , drainage- none, tonsils- atrophic Neck- flexible , trachea midline, no stridor , thyroid nl, carotid no bruit Chest - symmetrical excursion , unlabored           Heart/CV- RRR , no murmur , no gallop  , no rub, nl s1 s2                           - JVD- none , edema- none, stasis changes- none, varices- none  Lung- clear to P&A, wheeze- none, cough- none , dullness-none, rub- none.            Chest wall-  Abd-  Br/ Gen/ Rectal- Not done, not indicated Extrem- cyanosis- none, clubbing, none, atrophy- none, strength- nl Neuro- grossly intact to observation

## 2013-12-02 NOTE — Assessment & Plan Note (Signed)
She describes good compliance and control. Plan-order C Pap download for documentation

## 2013-12-02 NOTE — Patient Instructions (Signed)
Order- [facility]- increase CPAP to 14     Download CPAP for pressure, compliance once established on 14.    Dx OSA  Please call as needed

## 2014-05-13 ENCOUNTER — Telehealth: Payer: Self-pay | Admitting: Internal Medicine

## 2014-05-13 DIAGNOSIS — R059 Cough, unspecified: Secondary | ICD-10-CM

## 2014-05-13 DIAGNOSIS — R05 Cough: Secondary | ICD-10-CM

## 2014-05-13 NOTE — Telephone Encounter (Signed)
Pt returned call - [phone]

## 2014-05-13 NOTE — Telephone Encounter (Signed)
Called and spoke to pt. Pt requesting a CXR to follow up with her persistent cough, pt stated there is mucus production at night- pt unsure of color. Pt denies increase in SOB, CP/tightness or f/c/s.   CY please advise if ok to order CXR.

## 2014-05-13 NOTE — Telephone Encounter (Signed)
Order placed and pt aware. Nothing further needed 

## 2014-05-13 NOTE — Telephone Encounter (Signed)
lmomtcb x1 for pt 

## 2014-05-13 NOTE — Telephone Encounter (Signed)
Yes- please order CXR for dx cough

## 2014-05-17 ENCOUNTER — Ambulatory Visit (INDEPENDENT_AMBULATORY_CARE_PROVIDER_SITE_OTHER)
Admission: RE | Admit: 2014-05-17 | Discharge: 2014-05-17 | Disposition: A | Discharge status: Home / Self Care | Payer: Medicare Other | Source: Ambulatory Visit | Attending: Internal Medicine | Admitting: Internal Medicine

## 2014-05-17 DIAGNOSIS — R05 Cough: Secondary | ICD-10-CM

## 2014-05-17 DIAGNOSIS — R059 Cough, unspecified: Secondary | ICD-10-CM

## 2014-05-20 ENCOUNTER — Telehealth: Payer: Self-pay | Admitting: Internal Medicine

## 2014-05-20 NOTE — Telephone Encounter (Signed)
Relayed results to pt.  Nothing further needed.

## 2014-05-20 NOTE — Telephone Encounter (Signed)
Notes Recorded by Waymon Budgelinton D Young, MD on 05/17/2014 at 10:02 PM CXR- stable with nothing new. There is some emphysema, and an old scar. --- lmtcb x1 for pt.

## 2014-05-20 NOTE — Telephone Encounter (Signed)
Pt returning call.Tiffany Collier ° °

## 2014-05-20 NOTE — Telephone Encounter (Signed)
LMTC x 1  

## 2014-05-20 NOTE — Telephone Encounter (Signed)
Pt returned call

## 2014-05-28 ENCOUNTER — Telehealth: Payer: Self-pay | Admitting: Internal Medicine

## 2014-05-28 NOTE — Telephone Encounter (Signed)
I called spoke w/ pt. Made her aware do not see where anyone called her. She was already aware of CXR results. Nothing further needed

## 2014-12-03 ENCOUNTER — Ambulatory Visit: Payer: Medicare Other | Admitting: Internal Medicine

## 2015-05-05 ENCOUNTER — Ambulatory Visit: Payer: Medicare Other | Admitting: Internal Medicine

## 2015-05-12 ENCOUNTER — Ambulatory Visit (INDEPENDENT_AMBULATORY_CARE_PROVIDER_SITE_OTHER)
Admission: RE | Admit: 2015-05-12 | Discharge: 2015-05-12 | Disposition: A | Discharge status: Home / Self Care | Payer: Medicare Other | Source: Ambulatory Visit | Attending: Internal Medicine | Admitting: Internal Medicine

## 2015-05-12 ENCOUNTER — Ambulatory Visit (INDEPENDENT_AMBULATORY_CARE_PROVIDER_SITE_OTHER): Payer: Medicare Other | Admitting: Internal Medicine

## 2015-05-12 ENCOUNTER — Encounter: Payer: Self-pay | Admitting: Internal Medicine

## 2015-05-12 VITALS — BP 142/78 | HR 96 | Ht 66.0 in | Wt 160.0 lb

## 2015-05-12 DIAGNOSIS — R05 Cough: Secondary | ICD-10-CM

## 2015-05-12 DIAGNOSIS — G4733 Obstructive sleep apnea (adult) (pediatric): Secondary | ICD-10-CM | POA: Diagnosis not present

## 2015-05-12 DIAGNOSIS — R059 Cough, unspecified: Secondary | ICD-10-CM

## 2015-05-12 DIAGNOSIS — R053 Chronic cough: Secondary | ICD-10-CM

## 2015-05-12 DIAGNOSIS — Z77090 Contact with and (suspected) exposure to asbestos: Secondary | ICD-10-CM | POA: Diagnosis not present

## 2015-05-12 NOTE — Patient Instructions (Signed)
Order- CXR    Dx cough  Order- office spirometry      Order- [facility]-   Suggest try change CPAP  Mask- Patient choice, consider nasal pillows                                          Evaluate eligibility for replacement machine- auto 10-15, humidifier, supplies, AirView                                                     Dx OSA  Ok to try otc cough syrups like Delsym, throat lozenges, sips of liquids for cough. If these aren't suficent please call.

## 2015-05-12 NOTE — Progress Notes (Signed)
12/08/10- 41F never smoker followed for OSA. Dr Pete Glatter follows a lung nodule. Hypothyroid, Restless legs, allergic rhinitis. LOV-12/09/09 She feels well. She can tell with CPAP helps her and feels it is set correctly at 13. She is occasionally awakened feeling smothered, take a deep breath and gone back to sleep. She asked for a discussion of mouthpieces to treat sleep apnea. I reviewed these and answered her questions. 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  12/18/11- 89F never smoker followed for OSA. Dr Pete Glatter follows a lung nodule. Hypothyroid, Restless legs, allergic rhinitis Wears CPAP 13/ Advanced every night for about 7-8 hours and pressure working  well for patient; not due for supplies at this time. Minor occasional cough. Declines flu vaccine CXR 05/18/10 IMPRESSION:  1. No acute cardiopulmonary abnormality.  2. Stable benign left lateral lung nodule (unchanged since 2008).  Original Report Authenticated By: Harley Hallmark, M.D.   12/02/13- 61F never smoker followed for OSA. Dr Pete Glatter follows a lung nodule. Hypothyroid, Restless legs, allergic rhinitis CPAP 13/ Advanced FOLLOW FOR: Sleep Apnea; CPAP 13cm; Wears every night, approx 8 hours nightly; cough, fatigue x 4 days, dx: bronchitis by Dr. Argie Ramming She declines flu and pneumonia vaccines. Recent acute bronchitis treated by her primary physician with cough syrup, Augmentin, steroid shot.  05/12/2015-80 year old female never smoker followed for OSA. Dr. Pete Glatter follows a lung nodule. Complicated by hypothyroid, restless legs, allergic rhinitis CPAP 14/Advanced> auto 10-15 at this visit Follows for: OSA. Pt c/o continued cough with occasional white mucus. Pt reports cough is worse when she first wakes up. Pt denies wheeze/SOB/CP/tightness. Pt is on CPAP therapy and wear it nightly. Pt reports that she often feels  "suffocated" by the CPAP.  Download confirms excellent compliance and control Describes cough for at least a year, mostly dry. Not related to eating or position and without wheeze or shortness of breath. Normal PFT 2009. Doesn't want cough medicine. Office Spirometry 05/12/2015-normal. FEV1/FVC 0.82 CXR 05/17/14 IMPRESSION: Underlying emphysematous change. Calcified granuloma left lower lobe. No edema or consolidation. Electronically Signed  By: Bretta Bang III M.D.  On: 05/17/2014 11:18  ROS-see HPI Constitutional:   No-   weight loss, night sweats, fevers, chills, fatigue, lassitude. HEENT:   No-  headaches, difficulty swallowing, tooth/dental problems, sore throat,       No-  sneezing, itching, ear ache, nasal congestion, post nasal drip,  CV:  No-   chest pain, orthopnea, PND, swelling in lower extremities, anasarca, dizziness, palpitations Resp: No-   shortness of breath with exertion or at rest.              No-   productive cough,  + non-productive cough,  No- coughing up of blood.              No-   change in color of mucus.  No- wheezing.   Skin: No-   rash or lesions. GI:  No-   heartburn, indigestion, abdominal pain, nausea, vomiting, GU:  MS:  No-   joint pain or swelling.   Neuro-     nothing unusual Psych:  No- change in mood or affect. No depression or anxiety.  No memory loss.  OBJ General- Alert, Oriented, Affect-appropriate, Distress- none acute Skin- rash-none, lesions- none, excoriation- none Lymphadenopathy- none Head- atraumatic            Eyes- Gross vision intact, PERRLA, conjunctivae clear  secretions            Ears- Hearing, canals-normal            Nose- Clear, no-Septal dev, mucus, polyps, erosion, perforation             Throat- Mallampati II , mucosa clear , drainage- none, tonsils- atrophic Neck- flexible , trachea midline, no stridor , thyroid nl, carotid no bruit Chest - symmetrical excursion , unlabored           Heart/CV- RRR , no  murmur , no gallop  , no rub, nl s1 s2                           - JVD- none , edema- none, stasis changes- none, varices- none           Lung- clear to P&A, wheeze- none, cough- none , dullness-none, rub- none.            Chest wall-  Abd-  Br/ Gen/ Rectal- Not done, not indicated Extrem- cyanosis- none, clubbing, none, atrophy- none, strength- nl Neuro- grossly intact to observation

## 2015-05-13 ENCOUNTER — Telehealth: Payer: Self-pay | Admitting: Internal Medicine

## 2015-05-13 DIAGNOSIS — G4733 Obstructive sleep apnea (adult) (pediatric): Secondary | ICD-10-CM

## 2015-05-13 NOTE — Telephone Encounter (Signed)
Called and spoke to pt. Pt states she called her DME and was advised she is eligible for a new machine. Order placed. Pt verbalized understanding and denied any further questions or concerns at this time.   OV from 05/12/15: Patient Instructions       Order- CXR    Dx cough  Order- office spirometry      Order- DME Advanced-   Suggest try change CPAP  Mask- Patient choice, consider nasal pillows  Evaluate eligibility for replacement machine- auto 10-15, humidifier, supplies, AirView                                                      Dx OSA  Ok to try otc cough syrups like Delsym, throat lozenges, sips of liquids for cough. If these aren't suficent please call.

## 2015-05-28 DIAGNOSIS — R05 Cough: Secondary | ICD-10-CM | POA: Insufficient documentation

## 2015-05-28 DIAGNOSIS — R053 Chronic cough: Secondary | ICD-10-CM | POA: Insufficient documentation

## 2015-05-28 NOTE — Assessment & Plan Note (Signed)
Good compliance and control by download. Plan-DME company to evaluate older machine for replacement, with auto titration 10-15 to reassess pressure.

## 2015-05-28 NOTE — Assessment & Plan Note (Signed)
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

## 2015-05-28 NOTE — Assessment & Plan Note (Addendum)
Nonspecific dry cough with normal spirometry. Chest x-ray has suggested mild emphysema, possibly age-related. She is not seeking cough medication. Plan-update chest x-ray

## 2015-05-28 NOTE — Assessment & Plan Note (Signed)
Lung nodule consistent with old calcified granuloma, no concern

## 2015-06-17 ENCOUNTER — Telehealth: Payer: Self-pay | Admitting: Internal Medicine

## 2015-06-17 NOTE — Telephone Encounter (Signed)
LVM for pt to return call.  Okay to schedule with TP

## 2015-06-21 NOTE — Telephone Encounter (Signed)
lmtcb X2 for pt to reschedule appt

## 2015-06-21 NOTE — Telephone Encounter (Signed)
Pt is scheduled for 08/08/15 with TP at 10:45 Nothing further needed.

## 2015-06-21 NOTE — Telephone Encounter (Signed)
[phone], pt cb

## 2015-07-21 ENCOUNTER — Ambulatory Visit: Payer: PRIVATE HEALTH INSURANCE | Admitting: Internal Medicine

## 2015-08-08 ENCOUNTER — Encounter: Payer: Self-pay | Admitting: Adult Health

## 2015-08-08 ENCOUNTER — Ambulatory Visit (INDEPENDENT_AMBULATORY_CARE_PROVIDER_SITE_OTHER): Payer: Medicare Other | Admitting: Adult Health

## 2015-08-08 VITALS — BP 142/74 | HR 96 | Temp 98.0°F | Ht 66.0 in | Wt 148.0 lb

## 2015-08-08 DIAGNOSIS — G4733 Obstructive sleep apnea (adult) (pediatric): Secondary | ICD-10-CM

## 2015-08-08 NOTE — Progress Notes (Signed)
Subjective:    Patient ID: Tiffany Collier, female    DOB: 10-24-1935, 80 y.o.   MRN: [id number]  HPI 80 yo female never smoker with OSA   08/08/2015 Follow up : OSA  Pt returns for a 3 months follow up OSA. Uses CPAP on average 6-8 hours nightly. Masks fit well.  Recently changed to new CPAP machine which she loves. Feels rested.  Download shows excellent compliance at 100% usage. On 10-15cmH20 . AHI 0.9. +leaks but no sleep disturbance. Denies chest pain, orthopnea, edema or fever.    Past Medical History  Diagnosis Date  . Allergic rhinitis   . Sleep apnea   . Fibromyalgia   . Hypothyroid   . Lung nodule     LLL  . DA (degenerative arthritis)    Current Outpatient Prescriptions on File Prior to Visit  Medication Sig Dispense Refill  . Calcium Carbonate-Vit D-Min 600-400 MG-UNIT TABS Take 1 tablet by mouth 2 (two) times daily.     . Cholecalciferol (VITAMIN D) 2000 UNITS CAPS Take 1 capsule by mouth daily.      . Glucosamine Sulfate 1000 MG CAPS Take 1 capsule by mouth 2 (two) times daily.     Marland Kitchen. aspirin 81 MG tablet Take 81 mg by mouth daily. Reported on 08/08/2015    . triamcinolone cream (KENALOG) 0.1 % Reported on 08/08/2015     No current facility-administered medications on file prior to visit.    Review of Systems Constitutional:   No  weight loss, night sweats,  Fevers, chills, fatigue, or  lassitude.  HEENT:   No headaches,  Difficulty swallowing,  Tooth/dental problems, or  Sore throat,                No sneezing, itching, ear ache, nasal congestion, post nasal drip,   CV:  No chest pain,  Orthopnea, PND, swelling in lower extremities, anasarca, dizziness, palpitations, syncope.   GI  No heartburn, indigestion, abdominal pain, nausea, vomiting, diarrhea, change in bowel habits, loss of appetite, bloody stools.   Resp: No shortness of breath with exertion or at rest.  No excess mucus, no productive cough,  No non-productive cough,  No coughing up of blood.  No change  in color of mucus.  No wheezing.  No chest wall deformity  Skin: no rash or lesions.  GU: no dysuria, change in color of urine, no urgency or frequency.  No flank pain, no hematuria   MS:  No joint pain or swelling.  No decreased range of motion.  No back pain.  Psych:  No change in mood or affect. No depression or anxiety.  No memory loss.         Objective:   Physical Exam  Filed Vitals:   08/08/15 1118  BP: 142/74  Pulse: 96  Temp: 98 F (36.7 C)  TempSrc: Oral  Height: 5\' 6"  (1.676 m)  Weight: 148 lb (67.132 kg)  SpO2: 97%  Body mass index is 23.9 kg/(m^2).   GEN: A/Ox3; pleasant , NAD, elderly   HEENT:  [place]/AT,  EACs-clear, TMs-wnl, NOSE-clear, THROAT-clear, no lesions, no postnasal drip or exudate noted.   NECK:  Supple w/ fair ROM; no JVD; normal carotid impulses w/o bruits; no thyromegaly or nodules palpated; no lymphadenopathy.  RESP  Clear  P & A; w/o, wheezes/ rales/ or rhonchi.no accessory muscle use, no dullness to percussion  CARD:  RRR, no m/r/g  , no peripheral edema, pulses intact, no cyanosis or clubbing.  GI:  Soft & nt; nml bowel sounds; no organomegaly or masses detected.  Musco: Warm bil, no deformities or joint swelling noted.   Neuro: alert, no focal deficits noted.    Skin: Warm, no lesions or rashes  Lakelynn Severtson NP-C  [facility]  08/08/2015      Assessment & Plan:

## 2015-08-08 NOTE — Patient Instructions (Signed)
Continue on CPAP At bedtime   Keep up good work  Do not drive if sleepy.  Follow up with Dr. Maple HudsonYoung  In 1 year and As needed

## 2015-08-08 NOTE — Assessment & Plan Note (Signed)
Well controlled on CPAP   Plan  Continue on CPAP At bedtime   Keep up good work  Do not drive if sleepy.  Follow up with Dr. Maple HudsonYoung  In 1 year and As needed

## 2015-08-15 ENCOUNTER — Telehealth: Payer: Self-pay | Admitting: Internal Medicine

## 2015-08-15 NOTE — Telephone Encounter (Signed)
Can wipe it down with white vinegar

## 2015-08-15 NOTE — Telephone Encounter (Signed)
Pt returning call wanting to know if the wipe down was on the inside or out side of nose.Tiffany GriffinsStanley A Dalton

## 2015-08-15 NOTE — Telephone Encounter (Signed)
Last ov with Tiffany Collier on 05/12/15 Patient Instructions       Order- CXR    Dx cough  Order- office spirometry      Order- DME Advanced-   Suggest try change CPAP  Mask- Patient choice, consider nasal pillows                                          Evaluate eligibility for replacement machine- auto 10-15, humidifier, supplies, AirView                                                      Dx OSA  Ok to try otc cough syrups like Delsym, throat lozenges, sips of liquids for cough. If these aren't suficent please call.   Called spoke with pt. She states her sister was admitted to the hospital on 08/12/15 and was diagnosed with MRSA on 08/14/15. She is concerned because she has been taking care of her sister for the last 2 weeks. She denies any breathing issues, fever, nausea or vomiting. She is worried that her CPAP mask could be contaminated. She states she has washed and changed the filters. [facility]. I explained to her that I would contact her after we received his results and recs. She voiced understanding and had no further questions.   Tiffany Collier please advise  Allergies  Allergen Reactions  . Latex     Current outpatient prescriptions:  .  acyclovir (ZOVIRAX) 400 MG tablet, Take 1 tablet by mouth daily., Disp: , Rfl:  .  aspirin 81 MG tablet, Take 81 mg by mouth daily. Reported on 08/08/2015, Disp: , Rfl:  .  Calcium Carbonate-Vit D-Min 600-400 MG-UNIT TABS, Take 1 tablet by mouth 2 (two) times daily. , Disp: , Rfl:  .  Cholecalciferol (VITAMIN D) 2000 UNITS CAPS, Take 1 capsule by mouth daily.  , Disp: , Rfl:  .  cyclobenzaprine (FLEXERIL) 10 MG tablet, Take 1 tablet by mouth every 8 hours as needed, Disp: , Rfl:  .  gabapentin (NEURONTIN) 800 MG tablet, Take 1 tablet by mouth every 8 hours as needed, Disp: , Rfl:  .  Glucosamine Sulfate 1000 MG CAPS, Take 1 capsule by mouth 2 (two) times daily. , Disp: , Rfl:  .  polyethylene glycol (MIRALAX / GLYCOLAX) packet,  Take 17 g by mouth daily., Disp: , Rfl:  .  triamcinolone cream (KENALOG) 0.1 %, Reported on 08/08/2015, Disp: , Rfl:

## 2015-08-15 NOTE — Telephone Encounter (Signed)
LMOMTCB x 1 

## 2015-08-15 NOTE — Telephone Encounter (Signed)
Called spoke with pt. Aware of recs below. She needed nothing further

## 2015-11-09 ENCOUNTER — Ambulatory Visit: Payer: PRIVATE HEALTH INSURANCE | Admitting: Internal Medicine

## 2016-04-30 ENCOUNTER — Ambulatory Visit (INDEPENDENT_AMBULATORY_CARE_PROVIDER_SITE_OTHER): Payer: Medicare Other | Admitting: Internal Medicine

## 2016-04-30 ENCOUNTER — Ambulatory Visit (INDEPENDENT_AMBULATORY_CARE_PROVIDER_SITE_OTHER)
Admission: RE | Admit: 2016-04-30 | Discharge: 2016-04-30 | Disposition: A | Discharge status: Home / Self Care | Payer: Medicare Other | Source: Ambulatory Visit | Attending: Internal Medicine | Admitting: Internal Medicine

## 2016-04-30 ENCOUNTER — Encounter: Payer: Self-pay | Admitting: Internal Medicine

## 2016-04-30 VITALS — BP 132/78 | HR 75 | Ht 66.0 in | Wt 145.4 lb

## 2016-04-30 DIAGNOSIS — R059 Cough, unspecified: Secondary | ICD-10-CM

## 2016-04-30 DIAGNOSIS — R05 Cough: Secondary | ICD-10-CM

## 2016-04-30 DIAGNOSIS — G4733 Obstructive sleep apnea (adult) (pediatric): Secondary | ICD-10-CM

## 2016-04-30 DIAGNOSIS — Z77090 Contact with and (suspected) exposure to asbestos: Secondary | ICD-10-CM

## 2016-04-30 NOTE — Progress Notes (Signed)
HPI  female never smoker followed for OSA. Dr. Pete Glatter follows a lung nodule. Complicated by hypothyroid, restless legs, allergic rhinitis NPSG 12/21/03- AHI 31/ hr, desaturation to 87%, mild PLMS, body weight 135 lbs Normal PFT 2009. Doesn't want cough medicine. Office Spirometry 05/12/2015-normal. FEV1/FVC 0.82  ----------------------------------------------------------------------------------.  05/12/2015-81 year old female never smoker followed for OSA. Dr. Pete Glatter follows a lung nodule. Complicated by hypothyroid, restless legs, allergic rhinitis CPAP 14/Advanced> auto 10-15 at this visit Follows for: OSA. Pt c/o continued cough with occasional white mucus. Pt reports cough is worse when she first wakes up. Pt denies wheeze/SOB/CP/tightness. Pt is on CPAP therapy and wear it nightly. Pt reports that she often feels "suffocated" by the CPAP.  Download confirms excellent compliance and control Describes cough for at least a year, mostly dry. Not related to eating or position and without wheeze or shortness of breath. Normal PFT 2009. Doesn't want cough medicine. Office Spirometry 05/12/2015-normal. FEV1/FVC 0.82 CXR 05/17/14 IMPRESSION: Underlying emphysematous change. Calcified granuloma left lower lobe. No edema or consolidation. Electronically Signed  By: Bretta Bang III M.D.  On: 05/17/2014 11:18  04/30/2016- 81 year old female never smoker followed for OSA. Dr. Pete Glatter follows a lung nodule. Complicated by hypothyroid, restless legs, allergic rhinitis CPAP auto 10-15/Advanced> today 10-18 LOV 08/08/2015-NP-was doing well with CPAP and new machine FOLLOWS FOR: DME [facility]. Pt is wearing CPAP nightly and needs order to go to [facility] tomorrow for water chamber issues. DL attached.  CPAP humidifier keep running dry. Download shows 100% 4 hour compliance with residual AHI 1.4/hour. Admits some dry cough occasionally without wheeze  ROS-see HPI Constitutional:   No-   weight loss,  night sweats, fevers, chills, fatigue, lassitude. HEENT:   No-  headaches, difficulty swallowing, tooth/dental problems, sore throat,       No-  sneezing, itching, ear ache, nasal congestion, post nasal drip,  CV:  No-   chest pain, orthopnea, PND, swelling in lower extremities, anasarca, dizziness, palpitations Resp: No-   shortness of breath with exertion or at rest.              No-   productive cough,  + non-productive cough,  No- coughing up of blood.              No-   change in color of mucus.  No- wheezing.   Skin: No-   rash or lesions. GI:  No-   heartburn, indigestion, abdominal pain, nausea, vomiting, GU:  MS:  No-   joint pain or swelling.   Neuro-     nothing unusual Psych:  No- change in mood or affect. No depression or anxiety.  No memory loss.  OBJ General- Alert, Oriented, Affect-appropriate, Distress- none acute, not obese Skin- rash-none, lesions- none, excoriation- none Lymphadenopathy- none Head- atraumatic            Eyes- Gross vision intact, PERRLA, conjunctivae clear secretions            Ears- Hearing, canals-normal            Nose- Clear, no-Septal dev, mucus, polyps, erosion, perforation             Throat- Mallampati II , mucosa clear/not dry , drainage- none, tonsils- atrophic Neck- flexible , trachea midline, no stridor , thyroid nl, carotid no bruit Chest - symmetrical excursion , unlabored           Heart/CV- RRR , no murmur , no gallop  , no rub, nl s1 s2                           -  JVD- none , edema- none, stasis changes- none, varices- none           Lung- + coarse right base, wheeze- none, cough- none , dullness-none, rub- none.            Chest wall-  Abd-  Br/ Gen/ Rectal- Not done, not indicated Extrem- cyanosis- none, clubbing, none, atrophy- none, strength- nl Neuro- grossly intact to observation

## 2016-04-30 NOTE — Patient Instructions (Signed)
Order- DME Advanced- please increase auto CPAP range to 10-18, continue mask of choice, humidifier, supplies, AirView.  She needs help assessing/ adjusting humidifier please- runs dry.    Dx OSA  Order- CXR   Dx cough  Please call as needed

## 2016-05-01 ENCOUNTER — Telehealth: Payer: Self-pay | Admitting: Internal Medicine

## 2016-05-01 NOTE — Telephone Encounter (Signed)
Notes recorded by Cydney Okamara N Augustin, CMA on 05/01/2016 at 12:09 PM EDT ATC pt. Left message for pt to call back.  ------  Notes recorded by Waymon Budgelinton D Young, MD on 04/30/2016 at 5:15 PM EDT CXR- some changes of bronchitis and the old scar , but no new process or pneumonia seen   LMTCB

## 2016-05-02 ENCOUNTER — Telehealth: Payer: Self-pay | Admitting: Internal Medicine

## 2016-05-02 NOTE — Telephone Encounter (Signed)
Pt returned call. Informed her of the results and recs per CY. Pt verbalized understanding and requests the CXR report be mailed to her. Verified the home address and placed report in outgoing mail. Pt verbalized understanding and denied any further questions or concerns at this time.

## 2016-05-02 NOTE — Telephone Encounter (Signed)
Opened in error

## 2016-05-03 ENCOUNTER — Ambulatory Visit: Payer: Medicare Other | Admitting: Internal Medicine

## 2016-05-18 NOTE — Assessment & Plan Note (Signed)
Download confirms good compliance and control. He clearly feels better using CPAP. He needs help with his humidifier. Where he went to broaden his pressure range to auto 10-18

## 2016-05-18 NOTE — Assessment & Plan Note (Signed)
CXR 05/17/2014 without obvious asbestos exposure changes

## 2017-01-23 ENCOUNTER — Telehealth: Payer: Self-pay | Admitting: Internal Medicine

## 2017-01-23 DIAGNOSIS — G4733 Obstructive sleep apnea (adult) (pediatric): Secondary | ICD-10-CM

## 2017-01-23 NOTE — Telephone Encounter (Signed)
Patient returned call, CB [phone].

## 2017-01-23 NOTE — Telephone Encounter (Signed)
lmtcb x1 for pt. 

## 2017-01-23 NOTE — Telephone Encounter (Signed)
The form was faxed to us on 01/21/2017 and it is in Dr. Roxy CedarYoung's CMN folder for him to sign this week

## 2017-01-23 NOTE — Telephone Encounter (Signed)
Spoke with pt, [facility] saying that they had sent a request for cpap supplies to our office with paperwork needing to be filled out.  PCCs please advise if you have this paperwork, or if [facility] needs [facility].  Thanks.

## 2017-01-24 NOTE — Telephone Encounter (Signed)
CY - please advise. Thanks! 

## 2017-01-25 NOTE — Telephone Encounter (Signed)
Spoke with pt. Advised her that we are still waiting for CY to sign this form. She verbalized understanding.  CY - please advise when this form has been signed. Thanks.

## 2017-01-25 NOTE — Telephone Encounter (Signed)
Patient calling to check back on cpap supplies - she can be reached at [phone] -pr

## 2017-01-25 NOTE — Telephone Encounter (Signed)
Can you help me find this form please?

## 2017-01-25 NOTE — Telephone Encounter (Signed)
Pt is calling back. Cb [phone]

## 2017-01-25 NOTE — Telephone Encounter (Signed)
Form could not be located. I have placed a DME order for CPAP supplies. lmtcb x1 for pt.

## 2017-01-30 NOTE — Telephone Encounter (Signed)
[facility], he is out of the office until tomorrow. I will check status tomorrow.

## 2017-01-31 NOTE — Telephone Encounter (Signed)
Note   [facility]     ATC patient. Phone kept ringing. Will call back later.

## 2017-04-28 ENCOUNTER — Encounter: Payer: Self-pay | Admitting: Internal Medicine

## 2017-04-30 ENCOUNTER — Encounter: Payer: Self-pay | Admitting: Internal Medicine

## 2017-04-30 ENCOUNTER — Ambulatory Visit (INDEPENDENT_AMBULATORY_CARE_PROVIDER_SITE_OTHER): Payer: Medicare Other | Admitting: Internal Medicine

## 2017-04-30 VITALS — BP 128/64 | HR 71 | Ht 66.0 in | Wt 146.4 lb

## 2017-04-30 DIAGNOSIS — G4733 Obstructive sleep apnea (adult) (pediatric): Secondary | ICD-10-CM

## 2017-04-30 DIAGNOSIS — R911 Solitary pulmonary nodule: Secondary | ICD-10-CM

## 2017-04-30 DIAGNOSIS — R05 Cough: Secondary | ICD-10-CM | POA: Diagnosis not present

## 2017-04-30 DIAGNOSIS — R053 Chronic cough: Secondary | ICD-10-CM

## 2017-04-30 MED ORDER — UMECLIDINIUM-VILANTEROL 62.5-25 MCG/INH IN AEPB: 1.0000 | INHALATION_SPRAY | Freq: Every day | RESPIRATORY_TRACT | 0 refills | Status: DC

## 2017-04-30 NOTE — Assessment & Plan Note (Signed)
Occasional dyspnea on exertion is not significantly limiting.  She does not want another spirometry at this time.  CXR interpreted by radiology as "COPD".  She had a long secondhand smoking exposure history at work. Plan-sample Anoro inhaler for trial

## 2017-04-30 NOTE — Progress Notes (Signed)
HPI  female never smoker followed for OSA. Dr. Pete GlatterStoneking follows a lung nodule. Complicated by hypothyroid, restless legs, allergic rhinitis NPSG 12/21/03- AHI 31/ hr, desaturation to 87%, mild PLMS, body weight 135 lbs Normal PFT 2009. Doesn't want cough medicine. Office Spirometry 05/12/2015-normal. FEV1/FVC 0.82  ----------------------------------------------------------------------------------.  04/30/2016- 82 year old female never smoker followed for OSA. Dr. Pete GlatterStoneking follows a lung nodule. Complicated by hypothyroid, restless legs, allergic rhinitis CPAP auto 10-15/Advanced> today 10-18 LOV 08/08/2015-NP-was doing well with CPAP and new machine FOLLOWS FOR: DME [facility]. Pt is wearing CPAP nightly [facility] DL attached.  CPAP humidifier keep running dry. Download shows 100% 4 hour compliance with residual AHI 1.4/hour. Admits some dry cough occasionally without wheeze  04/30/17- 82 year old female never smoker followed for OSA. Dr. Pete GlatterStoneking follows a lung nodule. Complicated by hypothyroid, restless legs, allergic rhinitis CPAP auto 10-18/Advanced ----OSA: DME [facility]. Pt wears CPAP nightly and DL attached. No new supplies needed at this time.  Comfortable with her CPAP but humidifier is running dry during the night associated with increased mask leak. Download 100% compliance AHI 2.6/hour. Sometimes notices a little dyspnea on exertion but she is not concerned and chooses not to have this evaluated now. 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 CXR 04/30/16- COPD with as well as prior granulomatous disease. No acute abnormality noted.  ROS-see HPI   + = positive Constitutional:   No-   weight loss, night sweats, fevers, chills, fatigue, lassitude. HEENT:   No-  headaches, difficulty swallowing, tooth/dental problems, sore throat,       No-  sneezing, itching, ear ache, nasal congestion, post nasal drip,  CV:   No-   chest pain, orthopnea, PND, swelling in lower extremities, anasarca, dizziness, palpitations Resp: No-   shortness of breath with exertion or at rest.              No-   productive cough,  + non-productive cough,  No- coughing up of blood.              No-   change in color of mucus.  No- wheezing.   Skin: No-   rash or lesions. GI:  No-   heartburn, indigestion, abdominal pain, nausea, vomiting, GU:  MS:  No-   joint pain or swelling.   Neuro-     nothing unusual Psych:  No- change in mood or affect. No depression or anxiety.  No memory loss.  OBJ General- Alert, Oriented, Affect-appropriate, Distress- none acute, not obese Skin- rash-none, lesions- none, excoriation- none Lymphadenopathy- none Head- atraumatic            Eyes- Gross vision intact, PERRLA, conjunctivae clear secretions            Ears- Hearing, canals-normal            Nose- Clear, no-Septal dev, mucus, polyps, erosion, perforation             Throat- Mallampati II , mucosa clear/not dry , drainage- none, tonsils- atrophic Neck- flexible , trachea midline, no stridor , thyroid nl, carotid no bruit Chest - symmetrical excursion , unlabored           Heart/CV- RRR , no murmur , no gallop  , no rub, nl s1 s2                           -  JVD- none , edema- none, stasis changes- none, varices- none           Lung- + clear, wheeze- none, cough+ with deep breath, dullness-none, rub- none.            Chest wall-  Abd-  Br/ Gen/ Rectal- Not done, not indicated Extrem- cyanosis- none, clubbing, none, atrophy- none, strength- nl Neuro- grossly intact to observation

## 2017-04-30 NOTE — Patient Instructions (Addendum)
We can continue CPAP auto 10-18, mask of choice, humidifier, supplies, AirView  [facility]  Sample Anoro inhaler     Inhale 1 puff, once daily     See if this seems to help your breathing.

## 2017-04-30 NOTE — Assessment & Plan Note (Signed)
She sleeps better and feels better using CPAP.  Good compliance and control confirmed by download.  She would like to have mask fitting done hoping to reduce leaks. Plan-continue CPAP auto 10-18, schedule mask fitting

## 2017-04-30 NOTE — Assessment & Plan Note (Signed)
Consistent with old granuloma.  Stable consistent with benign lesion.

## 2017-05-27 ENCOUNTER — Other Ambulatory Visit (HOSPITAL_BASED_OUTPATIENT_CLINIC_OR_DEPARTMENT_OTHER): Payer: Medicare Other

## 2017-05-28 ENCOUNTER — Encounter: Payer: Self-pay | Admitting: Internal Medicine

## 2017-05-29 ENCOUNTER — Telehealth: Payer: Self-pay | Admitting: Internal Medicine

## 2017-05-29 DIAGNOSIS — G4733 Obstructive sleep apnea (adult) (pediatric): Secondary | ICD-10-CM

## 2017-05-29 NOTE — Telephone Encounter (Signed)
Per CY-let's change pressure from auto 10-18 to auto 5-20. [facility]. Nothing more needed at this time.

## 2018-05-01 ENCOUNTER — Ambulatory Visit: Payer: Medicare Other | Admitting: Internal Medicine

## 2018-08-04 ENCOUNTER — Encounter: Payer: Self-pay | Admitting: Pulmonary Disease

## 2018-08-04 ENCOUNTER — Ambulatory Visit (INDEPENDENT_AMBULATORY_CARE_PROVIDER_SITE_OTHER): Payer: Medicare Other | Admitting: Pulmonary Disease

## 2018-08-04 ENCOUNTER — Other Ambulatory Visit: Payer: Self-pay

## 2018-08-04 DIAGNOSIS — G4733 Obstructive sleep apnea (adult) (pediatric): Secondary | ICD-10-CM | POA: Diagnosis not present

## 2018-08-04 NOTE — Assessment & Plan Note (Addendum)
Plan: Continue CPAP therapy Follow-up with Dr. Annamaria Boots in 6 months Patient adamant the new pressure settings have helped her greatly, will delay changing settings at this time.  Note patient does have significant central apneas.  We will have patient follow-up in a closer timeframe of a 35-month follow-up instead of a year follow-up to see Dr. Annamaria Boots.  Patient is not interested in coming to the office within 3 months to the COVID-19 pandemic.

## 2018-08-04 NOTE — Patient Instructions (Addendum)
Continue CPAP Use  DME: [facility]  Supplies  Mask   We recommend that you continue using your CPAP daily >>>Keep up the hard work using your device >>> Goal should be wearing this for the entire night that you are sleeping, at least 4 to 6 hours  Remember:  . Do not drive or operate heavy machinery if tired or drowsy.  . Please notify the supply company and office if you are unable to use your device regularly due to missing supplies or machine being broken.  . Work on maintaining a healthy weight and following your recommended nutrition plan  . Maintain proper daily exercise and movement  . Maintaining proper use of your device can also help improve management of other chronic illnesses such as: Blood pressure, blood sugars, and weight management.   BiPAP/ CPAP Cleaning:  >>>Clean weekly, with Dawn soap, and bottle brush.  Set up to air dry.   Return in about 6 months (around 02/03/2019), or if symptoms worsen or fail to improve, for Follow up with Dr. Annamaria Boots.  Coronavirus (COVID-19) Are you at risk?  Are you at risk for the Coronavirus (COVID-19)?  To be considered HIGH RISK for Coronavirus (COVID-19), you have to meet the following criteria:  . Traveled to [place], [place], [place], [place] or [place]; or in the [place] to [place], [place], [place], or [place]; and have fever, cough, and shortness of breath within the last 2 weeks of travel OR . Been in close contact with a person diagnosed with COVID-19 within the last 2 weeks and have fever, cough, and shortness of breath . IF YOU DO NOT MEET THESE CRITERIA, YOU ARE CONSIDERED LOW RISK FOR COVID-19.  What to do if you are HIGH RISK for COVID-19?  Marland Kitchen If you are having a medical emergency, call 911. . Seek medical care right away. Before you go to a doctor's office, urgent care or emergency department, call ahead and tell them about your recent travel, contact with someone diagnosed with COVID-19, and your symptoms. You  should receive instructions from your physician's office regarding next steps of care.  . When you arrive at healthcare provider, tell the healthcare staff immediately you have returned from visiting [place], [place], [place], [place] or [place]; or traveled in the [place] to [place], [place], [place], or [place]; in the last two weeks or you have been in close contact with a person diagnosed with COVID-19 in the last 2 weeks.   . Tell the health care staff about your symptoms: fever, cough and shortness of breath. . After you have been seen by a medical provider, you will be either: o Tested for (COVID-19) and discharged home on quarantine except to seek medical care if symptoms worsen, and asked to  - Stay home and avoid contact with others until you get your results (4-5 days)  - Avoid travel on public transportation if possible (such as bus, train, or airplane) or o Sent to the Emergency Department by EMS for evaluation, COVID-19 testing, and possible admission depending on your condition and test results.  What to do if you are LOW RISK for COVID-19?  Reduce your risk of any infection by using the same precautions used for avoiding the common cold or flu:  Marland Kitchen Wash your hands often with soap and warm water for at least 20 seconds.  If soap and water are not readily available, use an alcohol-based hand sanitizer with at least 60% alcohol.  Marland Kitchen  If coughing or sneezing, cover your mouth and nose by coughing or sneezing into the elbow areas of your shirt or coat, into a tissue or into your sleeve (not your hands). . Avoid shaking hands with others and consider head nods or verbal greetings only. . Avoid touching your eyes, nose, or mouth with unwashed hands.  . Avoid close contact with people who are sick. . Avoid places or events with large numbers of people in one location, like concerts or sporting events. . Carefully consider travel plans you have or are making. . If you are planning  any travel outside or inside the [place], visit the CDC's Travelers' Health webpage for the latest health notices. . If you have some symptoms but not all symptoms, continue to monitor at home and seek medical attention if your symptoms worsen. . If you are having a medical emergency, call 911.   ADDITIONAL HEALTHCARE OPTIONS FOR PATIENTS  [facility] / e-Visit: [email or web address]         [facility]: [phone]  [facility]: [phone]                   [facility]: [phone]           It is flu season:   >>> Best ways to protect herself from the flu: Receive the yearly flu vaccine, practice good hand hygiene washing with soap and also using hand sanitizer when available, eat a nutritious meals, get adequate rest, hydrate appropriately   Please contact the office if your symptoms worsen or you have concerns that you are not improving.   Thank you for choosing [facility] for your healthcare, and for allowing us to partner with you on your healthcare journey. I am thankful to be able to provide care to you today.   Elisha HeadlandBrian Eliot Popper FNP-C

## 2018-08-04 NOTE — Progress Notes (Signed)
Virtual Visit via Telephone Note  I connected with Tiffany Collier  on 08/04/18 at  3:00 PM EDT by telephone and verified that I am speaking with the correct person using two identifiers.  Location: Patient: Home Provider: [facility] - [address], [place], [place], [place] [address]   I discussed the limitations, risks, security and privacy concerns of performing an evaluation and management service by telephone and the availability of in person appointments. I also discussed with the patient that there may be a patient responsible charge related to this service. The patient expressed understanding and agreed to proceed.  Patient consented to consult via telephone: Yes People present and their role in pt care: Pt    History of Present Illness: 83 year old female never smoker followed in our office for obstructive sleep apnea  Maintenance: None  Patient of Dr. Annamaria Boots  Chief complaint: CPAP Follow up   83 year old female never smoker followed in our office for obstructive sleep apnea patient reporting since last office visit she has had a heart attack will update her medications have been added today.  She also is no longer using her Anoro Ellipta.  She is now using levalbuterol twice daily via nebulizer which she reports has been managing her breathing well.   CPAP compliance report shows excellent compliance.  See compliance report listed below:  07/05/2018-08/03/2018 20-30 had a last 30 days use, 30 those days greater than 4 hours, average usage 8 hours and 3 minutes, APAP settings 5-20, AHI 11.8, central apneas 5.2, obstructive 4.3, 95th percentile 13.9  Patient reports that clinically she feels that CPAP helps greatly with her sleep.  She reports that when she tried to sleep without her CPAP she slept terribly.  She does not want to stop using it.  She feels no change in her pressure settings which happened in April/2019 has helped greatly going to the APAP 5-20  setting.  Observations/Objective:  05/12/2015-spirometry- FVC 3.1 (103% predicted), ratio 82, FEV1 2.5 (116% predicted) Normal spirometry  NPSG 12/21/03- AHI 31/ hr, desaturation to 87%, mild PLMS, body weight 135 lbs Normal PFT 2009.   Assessment and Plan:  Obstructive sleep apnea Plan: Continue CPAP therapy Follow-up with Dr. Annamaria Boots in 6 months Patient adamant the new pressure settings have helped her greatly, will delay changing settings at this time.  Note patient does have significant central apneas.  We will have patient follow-up in a closer timeframe of a 86-month follow-up instead of a year follow-up to see Dr. Annamaria Boots.  Patient is not interested in coming to the office within 3 months to the COVID-19 pandemic.  Follow Up Instructions:  Return in about 6 months (around 02/03/2019), or if symptoms worsen or fail to improve, for Follow up with Dr. Annamaria Boots.   I discussed the assessment and treatment plan with the patient. The patient was provided an opportunity to ask questions and all were answered. The patient agreed with the plan and demonstrated an understanding of the instructions.   The patient was advised to call back or seek an in-person evaluation if the symptoms worsen or if the condition fails to improve as anticipated.  I provided 23 minutes of non-face-to-face time during this encounter.   [name], NP

## 2019-02-09 ENCOUNTER — Ambulatory Visit: Payer: Medicare Other | Admitting: Internal Medicine

## 2020-02-15 ENCOUNTER — Ambulatory Visit: Payer: Medicare Other | Admitting: Pulmonary Disease

## 2020-09-26 ENCOUNTER — Other Ambulatory Visit (HOSPITAL_COMMUNITY): Payer: Self-pay

## 2020-10-24 ENCOUNTER — Other Ambulatory Visit (HOSPITAL_COMMUNITY): Payer: Self-pay

## 2020-10-26 ENCOUNTER — Other Ambulatory Visit (HOSPITAL_COMMUNITY): Payer: Self-pay

## 2020-10-26 MED ORDER — SACUBITRIL-VALSARTAN 24-26 MG PO TABS
1.0000 | ORAL_TABLET | Freq: Two times a day (BID) | ORAL | 0 refills | Status: DC
  Filled 2020-10-26: qty 60, 30d supply, fill #0

## 2020-10-30 ENCOUNTER — Encounter: Payer: Self-pay | Admitting: Internal Medicine

## 2020-10-31 ENCOUNTER — Other Ambulatory Visit (HOSPITAL_COMMUNITY): Payer: Self-pay

## 2020-10-31 NOTE — Progress Notes (Signed)
HPI  female never smoker followed for OSA. Dr. Pete Glatter follows a lung nodule. Complicated by hypothyroid, restless legs, allergic rhinitis NPSG 12/21/03- AHI 31/ hr, desaturation to 87%, mild PLMS, body weight 135 lbs Normal PFT 2009. Doesn't want cough medicine. Office Spirometry 05/12/2015-normal. FEV1/FVC 0.82  ----------------------------------------------------------------------------------.   04/30/17- 85 year old female never smoker followed for OSA. Dr. Pete Glatter follows a lung nodule. Complicated by hypothyroid, restless legs, allergic rhinitis CPAP auto 10-18/[facility] ----OSA: DME [facility]. Pt wears CPAP nightly and DL attached. No new supplies needed at this time.  Comfortable with her CPAP but humidifier is running dry during the night associated with increased mask leak. Download 100% compliance AHI 2.6/hour. Sometimes notices a little dyspnea on exertion but she is not concerned and chooses not to have this evaluated now. 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 CXR 04/30/16- COPD with as well as prior granulomatous disease. No acute abnormality noted.  10/31/20-  85 year old female never smoker followed for OSA. Dr. Pete Glatter follows a lung nodule. Complicated by hypothyroid, restless legs, allergic rhinitis Hypothyroid, Fibromyalgia, Hx Asbestos Exposure, CAD/ MI/ LBBB/ AICD, CHF,  - Neb levalbuterol,  CPAP auto 5-20/ [facility] Download-compliance 100%, AHI 6/ hr  AirSense 10 AutoSet Body weight today- Covid vax- 2 Moderna Flu vax- LOV- NP- 2020- televisit-                       Son here Insurance won't pay for levalbuterol now, but her doctor in [place] advised her not to use albuterol neb because of her heart- discussed. We will try ipratropium neb. Cough got worse since not using nebulizer now.  Reacted to second Moderna vax and won't have more. Declines flu vax. Doing well with CPAP- machine is old. Discussed replacement.  ROS-see HPI   + =  positive Constitutional:   No-   weight loss, night sweats, fevers, chills, fatigue, lassitude. HEENT:   No-  headaches, difficulty swallowing, tooth/dental problems, sore throat,       No-  sneezing, itching, ear ache, nasal congestion, post nasal drip,  CV:  No-   chest pain, orthopnea, PND, swelling in lower extremities, anasarca, dizziness, palpitations Resp: No-   shortness of breath with exertion or at rest.              No-   productive cough,  + non-productive cough,  No- coughing up of blood.              No-   change in color of mucus.  No- wheezing.   Skin: No-   rash or lesions. GI:  No-   heartburn, indigestion, abdominal pain, nausea, vomiting, GU:  MS:  No-   joint pain or swelling.   Neuro-     nothing unusual Psych:  No- change in mood or affect. No depression or anxiety.  No memory loss.  OBJ General- Alert, Oriented, Affect-appropriate, Distress- none acute, not obese, + wheelchair Skin- rash-none, lesions- none, excoriation- none Lymphadenopathy- none Head- atraumatic            Eyes- Gross vision intact, PERRLA, conjunctivae clear secretions            Ears- Hearing, canals-normal            Nose- Clear, no-Septal dev, mucus, polyps, erosion, perforation             Throat- Mallampati II , mucosa clear/not dry , drainage- none, tonsils- atrophic Neck- flexible , trachea  midline, no stridor , thyroid nl, carotid no bruit Chest - symmetrical excursion , unlabored           Heart/CV- RRR , no murmur , no gallop  , no rub, nl s1 s2                           - JVD- none , edema- none, stasis changes- none, varices- none           Lung- + clear, wheeze- none, cough+ with deep breath, dullness-none, rub- none.            Chest wall-  Abd-  Br/ Gen/ Rectal- Not done, not indicated Extrem- cyanosis- none, clubbing, none, atrophy- none, strength- nl Neuro- grossly intact to observation

## 2020-11-01 ENCOUNTER — Telehealth: Payer: Self-pay | Admitting: Internal Medicine

## 2020-11-01 ENCOUNTER — Encounter: Payer: Self-pay | Admitting: Internal Medicine

## 2020-11-01 ENCOUNTER — Other Ambulatory Visit: Payer: Self-pay

## 2020-11-01 ENCOUNTER — Ambulatory Visit (INDEPENDENT_AMBULATORY_CARE_PROVIDER_SITE_OTHER): Payer: Medicare Other | Admitting: Internal Medicine

## 2020-11-01 ENCOUNTER — Other Ambulatory Visit (HOSPITAL_COMMUNITY): Payer: Self-pay

## 2020-11-01 ENCOUNTER — Ambulatory Visit (INDEPENDENT_AMBULATORY_CARE_PROVIDER_SITE_OTHER): Payer: Medicare Other

## 2020-11-01 VITALS — BP 122/62 | HR 82 | Temp 98.0°F | Ht 66.0 in | Wt 167.2 lb

## 2020-11-01 DIAGNOSIS — G4733 Obstructive sleep apnea (adult) (pediatric): Secondary | ICD-10-CM

## 2020-11-01 DIAGNOSIS — R053 Chronic cough: Secondary | ICD-10-CM

## 2020-11-01 MED ORDER — IPRATROPIUM BROMIDE 0.02 % IN SOLN
0.5000 mg | Freq: Four times a day (QID) | RESPIRATORY_TRACT | 12 refills | Status: DC
  Filled 2020-11-01: qty 150, 15d supply, fill #0

## 2020-11-01 NOTE — Assessment & Plan Note (Signed)
Nonspecific, worse off nebulizer. Plan- CXR. Try ipratropium neb solution, avoiding albuterol per other doctor's request.

## 2020-11-01 NOTE — Patient Instructions (Signed)
Script sent to change levalbuterol neb solution to ipratropium  Order- CXR    dx chronic cough  Order- DME [facility]- please replace old CPAP machine- auto5-20, mask of choice, humidifier, supplies, AirView/ card  Please call if we can help

## 2020-11-01 NOTE — Assessment & Plan Note (Signed)
Benefits from CPAP with good compliance and control Plan-replace old CPAP, auto 5-20

## 2020-11-01 NOTE — Telephone Encounter (Signed)
I called and spoke with pt and she is aware of meds that have been updated per pt.     She stated that the ipratropium that was sent to the pharmacy today, she is having a hard time getting this medication.  She said it has to be filed under her medicare part B and 2 pharmacies so far did not have it. She wanted CY to be aware of this.  She will call back if needed.

## 2020-11-04 NOTE — Progress Notes (Signed)
LMOM for patient to call office back. Call back information provided.

## 2020-11-07 ENCOUNTER — Telehealth: Payer: Self-pay | Admitting: Internal Medicine

## 2020-11-07 NOTE — Telephone Encounter (Signed)
Tiffany Budge, MD  11/01/2020 11:10 AM EDT     CXR- looks fine with no new problem or explanation for cough.    Attempted to call pt but unable to reach. Left message for her to return call.

## 2020-11-08 NOTE — Telephone Encounter (Signed)
I have attempted to call the pt but the VM is full.  Will try and call back later.

## 2020-11-08 NOTE — Telephone Encounter (Signed)
Patient is returning phone call. Patient phone number is [phone].

## 2020-11-10 NOTE — Telephone Encounter (Signed)
Call made to patient, confirmed DOB. Made aware of CXR results per CY.Voiced understanding.   Nothing further needed at this time.

## 2020-12-05 NOTE — Progress Notes (Signed)
Cardiology Office Note:    Date:  12/16/2020   ID:  Tiffany Collier, DOB 28-Dec-1935, MRN [id number]  PCP:  Merlene Laughter, MD   [facility] Providers Cardiologist:  Christell Constant, MD     Referring MD: Merlene Laughter, MD   CC: Establish care Consulted for the evaluation of CAD at the behest of Lyons, Ann Maki, MD  History of Present Illness:    Tiffany Collier is a 85 y.o. female with a hx of CAD with HFrEF EF 40% prior PCI to mLAD in 2018 with prior care at [facility] in [place] ([place] Stressed 2019% EF 40%- Echo 30-35% in 2018), COPD and OSA on CPAP who presents for evaluation 12/16/20.    Patient notes that she is feeling Ok now:  in 2018 she has a LAD NSTEMI s/p PCI with anginal chest pain.  Had an EF  of 20% at that time (reported 30-35% through old records).  She had EF recovery to 40%.  [place]in [place] but had [facility] care) and had two NSTEMI without further vascular intervention.  She had a BosSci CRT-D placed and this was being followed in [place]; she is BiV Pacing now and she has never been shocked.  She notes she is not a medication person; she is on Coreg and Entresto but notes that if we cannot assist with the price of this she will stop taking it.  She is not a medication person.  She is able to do all ADLs but doesn't overexert herself.  Minimal chest residual chest pain.  Does not take PRN nitro.  No SOB, DOE, LE edema is minimal.  She takes lasix 20 mg PO daily PRN leg swelling and has not needed this for three weeks.    Works to monitor her salt intake.    Past Medical History:  Diagnosis Date   Allergic rhinitis    Bilateral pleural effusion    CAD (coronary artery disease)    CHF (congestive heart failure) (HCC)    DA (degenerative arthritis)    Dyspnea    Dyspnea    Fatigue    Fibromyalgia    History of coronary artery stent placement    HTN (hypertension)    Hypothyroid    Ischemic dilated  cardiomyopathy (HCC)    Lung nodule    LLL   NSTEMI (non-ST elevated myocardial infarction) (HCC)    RBBB    Sinus tachycardia    Sleep apnea     Past Surgical History:  Procedure Laterality Date   APPENDECTOMY     tah and bso     TONSILLECTOMY      Current Medications: Current Meds  Medication Sig   aspirin 81 MG EC tablet Take 81 mg by mouth daily. Swallow whole.   Cholecalciferol (VITAMIN D) 2000 UNITS CAPS Take 1 capsule by mouth daily. Pt. Take liquid cholecalciferol 2000 iu.   ipratropium (ATROVENT) 0.02 % nebulizer solution Take 2.5 mLs (0.5 mg total) by nebulization 4 (four) times daily.   ranolazine (RANEXA) 500 MG 12 hr tablet Take 1 tablet (500 mg total) by mouth 2 (two) times daily.   [DISCONTINUED] carvedilol (COREG) 3.125 MG tablet Take 3.125 mg by mouth 2 (two) times daily with a meal.   [DISCONTINUED] ranolazine (RANEXA) 1000 MG SR tablet Take 500 mg by mouth 2 (two) times daily.   [DISCONTINUED] sacubitril-valsartan (ENTRESTO) 24-26 MG Take 1 tablet by mouth 2 (two) times daily.  Allergies:   Latex and Other   Social History   Socioeconomic History   Marital status: Married    Spouse name: Not on file   Number of children: Not on file   Years of education: Not on file   Highest education level: Not on file  Occupational History   Occupation: retired    Comment: postal service  Tobacco Use   Smoking status: Never   Smokeless tobacco: Never  Substance and Sexual Activity   Alcohol use: Not on file   Drug use: Not on file   Sexual activity: Not on file  Other Topics Concern   Not on file  Social History Narrative   Not on file   Social Determinants of Health   Financial Resource Strain: Not on file  Food Insecurity: Not on file  Transportation Needs: Not on file  Physical Activity: Not on file  Stress: Not on file  Social Connections: Not on file     Family History: No history of CAD in family.  ROS:   Please see the history of  present illness.     All other systems reviewed and are negative.  EKGs/Labs/Other Studies Reviewed:    The following studies were reviewed today:   EKG:  EKG is  ordered today.  The ekg ordered today demonstrates  12/16/20: SR BivP  Transthoracic Echocardiogram: Date: 2018 Report only Results: EF 30-35%   Recent Labs: No results found for requested labs within last 8760 hours.  Recent Lipid Panel No results found for: CHOL, TRIG, HDL, CHOLHDL, VLDL, LDLCALC, LDLDIRECT   Physical Exam:    VS:  BP 124/62   Pulse 74   Ht 5\' 6"  (1.676 m)   Wt 163 lb 6.4 oz (74.1 kg)   SpO2 97%   BMI 26.37 kg/m     Wt Readings from Last 3 Encounters:  12/16/20 163 lb 6.4 oz (74.1 kg)  11/01/20 167 lb 3.2 oz (75.8 kg)  04/30/17 146 lb 6.4 oz (66.4 kg)    Gen: no distress, elderly female Neck: No JVD Cardiac: No Rubs or Gallops, no Murmur, normal rhythm +2 radial pulses Respiratory: Clear to auscultation bilaterally, normal effort, normal  respiratory rate GI: Soft, nontender, non-distended  MS: trace edema bilateral;  moves all extremities Integument: Skin feels warm Neuro:  At time of evaluation, alert and oriented to person/place/time/situation  Psych: Normal affect, patient feels OK   ASSESSMENT:    1. Heart failure with reduced ejection fraction (HCC)   2. S/P ICD (internal cardiac defibrillator) procedure   3. Coronary artery disease of native artery of native heart with stable angina pectoris (HCC)    PLAN:    Coronary Artery Disease - asymptomatic with prior mLAD stent COPD OSA on CPAP  Heart Failure Reduced Ejection Fraction  - NYHA class I, Stage B, euvolemic, etiology from CAD - Diuretic regimen: Lasix 20 mg PO PRN - Discussed the importance of fluid restriction of < 2 L, salt restriction, and checking daily weights, Holiday foods and risk - Coreg 3.125 for now - ARNI 24/26; we will work to support her with PT assistance application, if she is not able to afford  this she will call [phone] and will start losartan 25 mg PO daily - will get echo - patient is unclear is she has had SGLT2i or MRA before, we have written these down and she will double check; may add SGLT2i next - s/p CRT-D Bos Sci- will get set up with EP and  device management - on ASA, and Ranolazine presumably for CAD (had denied VT hx) no changes at this time  Three month f/u with me   Medication Adjustments/Labs and Tests Ordered: Current medicines are reviewed at length with the patient today.  Concerns regarding medicines are outlined above.  Orders Placed This Encounter  Procedures   Ambulatory referral to Cardiac Electrophysiology   EKG 12-Lead   ECHOCARDIOGRAM COMPLETE   Meds ordered this encounter  Medications   carvedilol (COREG) 3.125 MG tablet    Sig: Take 1 tablet (3.125 mg total) by mouth 2 (two) times daily with a meal.    Dispense:  180 tablet    Refill:  3   sacubitril-valsartan (ENTRESTO) 24-26 MG    Sig: Take 1 tablet by mouth 2 (two) times daily.    Dispense:  180 tablet    Refill:  3   ranolazine (RANEXA) 500 MG 12 hr tablet    Sig: Take 1 tablet (500 mg total) by mouth 2 (two) times daily.    Dispense:  180 tablet    Refill:  3    Patient Instructions  Medication Instructions:  Your physician recommends that you continue on your current medications as directed. Please refer to the Current Medication list given to you today.  *If you need a refill on your cardiac medications before your next appointment, please call your pharmacy*   Lab Work: NONE If you have labs (blood work) drawn today and your tests are completely normal, you will receive your results only by: MyChart Message (if you have MyChart) OR A paper copy in the mail If you have any lab test that is abnormal or we need to change your treatment, we will call you to review the results.   Testing/Procedures: Your physician has requested that you have an echocardiogram. Echocardiography is  a painless test that uses sound waves to create images of your heart. It provides your doctor with information about the size and shape of your heart and how well your heart's chambers and valves are working. This procedure takes approximately one hour. There are no restrictions for this procedure.  Your physician has referred you to see an EP specialist.    Follow-Up: At [facility], you and your health needs are our priority.  As part of our continuing mission to provide you with exceptional heart care, we have created designated Provider Care Teams.  These Care Teams include your primary Cardiologist (physician) and Advanced Practice Providers (APPs -  Physician Assistants and Nurse Practitioners) who all work together to provide you with the care you need, when you need it.  We recommend signing up for the patient portal called "MyChart".  Sign up information is provided on this After Visit Summary.  MyChart is used to connect with patients for Virtual Visits (Telemedicine).  Patients are able to view lab/test results, encounter notes, upcoming appointments, etc.  Non-urgent messages can be sent to your provider as well.   To learn more about what you can do with MyChart, go to [email or web address].    Your next appointment:   3 month(s)  The format for your next appointment:   In Person  Provider:   Christell Constant, MD          Signed, Christell Constant, MD  12/16/2020 3:02 PM    [facility]

## 2020-12-16 ENCOUNTER — Other Ambulatory Visit: Payer: Self-pay

## 2020-12-16 ENCOUNTER — Ambulatory Visit (INDEPENDENT_AMBULATORY_CARE_PROVIDER_SITE_OTHER): Payer: Medicare Other | Admitting: Internal Medicine

## 2020-12-16 ENCOUNTER — Other Ambulatory Visit (HOSPITAL_COMMUNITY): Payer: Self-pay

## 2020-12-16 ENCOUNTER — Encounter: Payer: Self-pay | Admitting: Internal Medicine

## 2020-12-16 VITALS — BP 124/62 | HR 74 | Ht 66.0 in | Wt 163.4 lb

## 2020-12-16 DIAGNOSIS — I25118 Atherosclerotic heart disease of native coronary artery with other forms of angina pectoris: Secondary | ICD-10-CM | POA: Diagnosis not present

## 2020-12-16 DIAGNOSIS — Z95810 Presence of automatic (implantable) cardiac defibrillator: Secondary | ICD-10-CM | POA: Diagnosis not present

## 2020-12-16 DIAGNOSIS — I5020 Unspecified systolic (congestive) heart failure: Secondary | ICD-10-CM | POA: Insufficient documentation

## 2020-12-16 MED ORDER — CARVEDILOL 3.125 MG PO TABS
3.1250 mg | ORAL_TABLET | Freq: Two times a day (BID) | ORAL | 3 refills | Status: DC
  Filled 2020-12-16: qty 180, 90d supply, fill #0
  Filled 2021-03-09: qty 180, 90d supply, fill #1
  Filled 2021-06-14: qty 180, 90d supply, fill #2
  Filled 2021-09-12: qty 180, 90d supply, fill #3

## 2020-12-16 MED ORDER — RANOLAZINE ER 500 MG PO TB12
500.0000 mg | ORAL_TABLET | Freq: Two times a day (BID) | ORAL | 3 refills | Status: DC
  Filled 2020-12-16: qty 180, 90d supply, fill #0
  Filled 2021-03-09: qty 180, 90d supply, fill #1

## 2020-12-16 MED ORDER — SACUBITRIL-VALSARTAN 24-26 MG PO TABS
1.0000 | ORAL_TABLET | Freq: Two times a day (BID) | ORAL | 3 refills | Status: DC
Start: 2020-12-16 — End: 2021-12-13
  Filled 2020-12-16: qty 180, 90d supply, fill #0
  Filled 2021-03-09: qty 180, 90d supply, fill #1
  Filled 2021-06-14: qty 180, 90d supply, fill #2
  Filled 2021-09-12: qty 180, 90d supply, fill #3

## 2020-12-16 NOTE — Patient Instructions (Signed)
Medication Instructions:  Your physician recommends that you continue on your current medications as directed. Please refer to the Current Medication list given to you today.  *If you need a refill on your cardiac medications before your next appointment, please call your pharmacy*   Lab Work: NONE If you have labs (blood work) drawn today and your tests are completely normal, you will receive your results only by: MyChart Message (if you have MyChart) OR A paper copy in the mail If you have any lab test that is abnormal or we need to change your treatment, we will call you to review the results.   Testing/Procedures: Your physician has requested that you have an echocardiogram. Echocardiography is a painless test that uses sound waves to create images of your heart. It provides your doctor with information about the size and shape of your heart and how well your heart's chambers and valves are working. This procedure takes approximately one hour. There are no restrictions for this procedure.  Your physician has referred you to see an EP specialist.    Follow-Up: At [facility], you and your health needs are our priority.  As part of our continuing mission to provide you with exceptional heart care, we have created designated Provider Care Teams.  These Care Teams include your primary Cardiologist (physician) and Advanced Practice Providers (APPs -  Physician Assistants and Nurse Practitioners) who all work together to provide you with the care you need, when you need it.  We recommend signing up for the patient portal called "MyChart".  Sign up information is provided on this After Visit Summary.  MyChart is used to connect with patients for Virtual Visits (Telemedicine).  Patients are able to view lab/test results, encounter notes, upcoming appointments, etc.  Non-urgent messages can be sent to your provider as well.   To learn more about what you can do with MyChart, go to  [email or web address].    Your next appointment:   3 month(s)  The format for your next appointment:   In Person  Provider:   Christell Constant, MD

## 2020-12-19 ENCOUNTER — Telehealth: Payer: Self-pay | Admitting: Internal Medicine

## 2020-12-19 NOTE — Telephone Encounter (Signed)
Left a message to call the office.  I have removed clopidogrel from pt med list.  I will leave a copy of EKG at front desk once it is scanned into Epic.

## 2020-12-19 NOTE — Telephone Encounter (Signed)
Pt c/o medication issue:  1. Name of Medication:  clopidogrel (PLAVIX) 75 MG tablet  2. How are you currently taking this medication (dosage and times per day)?  States she has not been taking   3. Are you having a reaction (difficulty breathing--STAT)?  No  4. What is your medication issue?   Patient states she no longer takes this medication and she would like to have it removed from her medication list. She states this was discussed during appointment on 11/11 with Dr. Izora Ribas.   Patient has follow up echo scheduled for 12/08. She would like to know if she needs to have lab work prior to this appointment. She would also like to pick up a copy of her EKG during this visit.

## 2020-12-19 NOTE — Addendum Note (Signed)
Addended by: Macie Burows on: 12/19/2020 11:11 AM   Modules accepted: Orders

## 2020-12-19 NOTE — Progress Notes (Signed)
Pt called in to report she wants clopidogrel removed from her medication list. Pt reports that she is not taking this medication.  Medication removed from med list reason: pt has not taken in the last 30 days.

## 2020-12-20 NOTE — Telephone Encounter (Signed)
Attempted to call the pt back and she did not answer and no VM was available at that time.

## 2020-12-20 NOTE — Telephone Encounter (Signed)
Tiffany Collier is returning [employer] call.

## 2020-12-21 NOTE — Telephone Encounter (Signed)
Left a message for pt to call back.  A copy of EKG from 12/16/20 left at the front desk for pt to pick up.

## 2020-12-21 NOTE — Telephone Encounter (Signed)
Spoke with pt, informed pt that clopidogrel was removed from her medication list and; that copy of EKG was left a the front desk for pt to pick up.  Pt thanked me for returning call.

## 2020-12-27 ENCOUNTER — Telehealth: Payer: Self-pay | Admitting: Internal Medicine

## 2020-12-27 NOTE — Telephone Encounter (Signed)
Spoke with the pt and notified that since her CPAP machine is new, her ins requires her to f/u 31-90 days. Appt was scheduled. Nothing further needed.

## 2021-01-06 ENCOUNTER — Other Ambulatory Visit (HOSPITAL_COMMUNITY): Payer: Medicare Other

## 2021-01-12 ENCOUNTER — Encounter: Payer: Self-pay | Admitting: Internal Medicine

## 2021-01-12 ENCOUNTER — Other Ambulatory Visit: Payer: Self-pay

## 2021-01-12 ENCOUNTER — Ambulatory Visit (HOSPITAL_COMMUNITY): Payer: Medicare Other | Attending: Cardiovascular Disease

## 2021-01-12 ENCOUNTER — Telehealth: Payer: Self-pay

## 2021-01-12 ENCOUNTER — Ambulatory Visit (INDEPENDENT_AMBULATORY_CARE_PROVIDER_SITE_OTHER): Payer: Medicare Other | Admitting: Internal Medicine

## 2021-01-12 VITALS — BP 122/74 | HR 76 | Ht 66.5 in | Wt 161.0 lb

## 2021-01-12 DIAGNOSIS — I5022 Chronic systolic (congestive) heart failure: Secondary | ICD-10-CM

## 2021-01-12 DIAGNOSIS — I5020 Unspecified systolic (congestive) heart failure: Secondary | ICD-10-CM

## 2021-01-12 DIAGNOSIS — Z95810 Presence of automatic (implantable) cardiac defibrillator: Secondary | ICD-10-CM

## 2021-01-12 LAB — ECHOCARDIOGRAM COMPLETE
Area-P 1/2: 3.3 cm2
MV M vel: 5.2 m/s
MV Peak grad: 108.2 mmHg
P 1/2 time: 574 msec
S' Lateral: 3.5 cm

## 2021-01-12 NOTE — Patient Instructions (Addendum)
Medication Instructions:  Your physician recommends that you continue on your current medications as directed. Please refer to the Current Medication list given to you today.  Labwork: None ordered.  Testing/Procedures: None ordered.  Follow-Up: Your physician wants you to follow-up in: one year with Lewayne Bunting, MD or one of the following Advanced Practice Providers on your designated Care Team:   Francis Dowse, [place] Casimiro Needle "Mardelle Matte" Lanna Poche, [place]  Remote monitoring is used to monitor your ICD from home. This monitoring reduces the number of office visits required to check your device to one time per year. It allows [place] to keep an eye on the functioning of your device to ensure it is working properly. You are scheduled for a device check from home on 04/12/2021. You may send your transmission at any time that day. If you have a wireless device, the transmission will be sent automatically. After your physician reviews your transmission, you will receive a postcard with your next transmission date.   Any Other Special Instructions Will Be Listed Below (If Applicable).  If you need a refill on your cardiac medications before your next appointment, please call your pharmacy.

## 2021-01-12 NOTE — Progress Notes (Signed)
HPI Tiffany Collier is referred by Dr. Izora Ribas for evaluation of and ongoing management of her ICD. She is a pleasant 85 yo woman with CAD s/p MI, first in [place] and then in [place]. The patient has been placed on GDMT. She underwent insertion of a [employer] Biv ICD in 2019. She denies any ICD therapies.  Allergies  Allergen Reactions   Latex    Other     CIGARETTE SMOKE, PERFUME, AEROSOLS AND ALL HOUSEHOLD CLEANERS     Current Outpatient Medications  Medication Sig Dispense Refill   aspirin 81 MG EC tablet Take 81 mg by mouth daily. Swallow whole.     carvedilol (COREG) 3.125 MG tablet Take 1 tablet (3.125 mg total) by mouth 2 (two) times daily with a meal. 180 tablet 3   Cholecalciferol (VITAMIN D) 2000 UNITS CAPS Take 1 capsule by mouth daily. Pt. Take liquid cholecalciferol 2000 iu.     ipratropium (ATROVENT) 0.02 % nebulizer solution Take 2.5 mLs (0.5 mg total) by nebulization 4 (four) times daily. 150 mL 12   ranolazine (RANEXA) 500 MG 12 hr tablet Take 1 tablet (500 mg total) by mouth 2 (two) times daily. 180 tablet 3   sacubitril-valsartan (ENTRESTO) 24-26 MG Take 1 tablet by mouth 2 (two) times daily. 180 tablet 3   No current facility-administered medications for this visit.     Past Medical History:  Diagnosis Date   Allergic rhinitis    Bilateral pleural effusion    CAD (coronary artery disease)    CHF (congestive heart failure) (HCC)    DA (degenerative arthritis)    Dyspnea    Dyspnea    Fatigue    Fibromyalgia    History of coronary artery stent placement    HTN (hypertension)    Hypothyroid    Ischemic dilated cardiomyopathy (HCC)    Lung nodule    LLL   NSTEMI (non-ST elevated myocardial infarction) (HCC)    RBBB    Sinus tachycardia    Sleep apnea     ROS:   All systems reviewed and negative except as noted in the HPI.   Past Surgical History:  Procedure Laterality Date   APPENDECTOMY     tah and bso     TONSILLECTOMY        No family history on file.   Social History   Socioeconomic History   Marital status: Married    Spouse name: Not on file   Number of children: Not on file   Years of education: Not on file   Highest education level: Not on file  Occupational History   Occupation: retired    Comment: postal service  Tobacco Use   Smoking status: Never   Smokeless tobacco: Never  Substance and Sexual Activity   Alcohol use: Not on file   Drug use: Not on file   Sexual activity: Not on file  Other Topics Concern   Not on file  Social History Narrative   Not on file   Social Determinants of Health   Financial Resource Strain: Not on file  Food Insecurity: Not on file  Transportation Needs: Not on file  Physical Activity: Not on file  Stress: Not on file  Social Connections: Not on file  Intimate Partner Violence: Not on file     BP 122/74   Pulse 76   Ht 5' 6.5" (1.689 m)   Wt 161 lb (73 kg)   BMI 25.60 kg/m   Physical  Exam:  Well appearing NAD HEENT: Unremarkable Neck:  No JVD, no thyromegally Lymphatics:  No adenopathy Back:  No CVA tenderness Lungs:  Clear with no wheezes HEART:  Regular rate rhythm, no murmurs, no rubs, no clicks Abd:  soft, positive bowel sounds, no organomegally, no rebound, no guarding Ext:  2 plus pulses, no edema, no cyanosis, no clubbing Skin:  No rashes no nodules Neuro:  CN II through XII intact, motor grossly intact  DEVICE  Normal device function.  See PaceArt for details.   Assess/Plan:  Chronic systolic heart failure - her symptoms are class 2 with biv pacing. She will continue her GDMT. ICD - her [employer] biv ICD is working normally. We will follow. CAD - she is s/p MI and has no additional anginal symptoms.  Tiffany Gowda Johann Santone,MD

## 2021-01-12 NOTE — Telephone Encounter (Signed)
LVM for patient advising that I was able to transfer her remote monitoring in [place].  It appears her monitor is currently communicating as it last transmitted data on 12/2.  Provided next scheduled check as 04/13/21.

## 2021-02-02 ENCOUNTER — Ambulatory Visit: Payer: Medicare Other | Admitting: Internal Medicine

## 2021-02-28 ENCOUNTER — Ambulatory Visit: Payer: Medicare Other | Admitting: Internal Medicine

## 2021-03-08 ENCOUNTER — Encounter: Payer: Self-pay | Admitting: Internal Medicine

## 2021-03-09 ENCOUNTER — Other Ambulatory Visit (HOSPITAL_COMMUNITY): Payer: Self-pay

## 2021-03-09 NOTE — Progress Notes (Signed)
HPI  female never smoker followed for OSA. Dr. Pete Glatter follows a lung nodule. Complicated by hypothyroid, restless legs, allergic rhinitis NPSG 12/21/03- AHI 31/ hr, desaturation to 87%, mild PLMS, body weight 135 lbs Normal PFT 2009. Doesn't want cough medicine. Office Spirometry 05/12/2015-normal. FEV1/FVC 0.82  ----------------------------------------------------------------------------------.   10/31/20-  86 year old female never smoker followed for OSA. Dr. Pete Glatter follows a lung nodule. Complicated by hypothyroid, restless legs, allergic rhinitis Hypothyroid, Fibromyalgia, Hx Asbestos Exposure, CAD/ MI/ LBBB/ AICD, CHF,  - Neb levalbuterol,  CPAP auto 5-20/ [employer] Download-compliance 100%, AHI 6/ hr  AirSense 10 AutoSet Body weight today- Covid vax- 2 Moderna Flu vax- LOV- NP- 2020- televisit-                       Son here Insurance won't pay for levalbuterol now, but her doctor in [place] advised her not to use albuterol neb because of her heart- discussed. We will try ipratropium neb. Cough got worse since not using nebulizer now.  Reacted to second Moderna vax and won't have more. Declines flu vax. Doing well with CPAP- machine is old. Discussed replacement.  03/10/21- 86 year old female never smoker followed for OSA. Dr. Pete Glatter follows a lung nodule. Complicated by hypothyroid, restless legs, allergic rhinitis Hypothyroid, Fibromyalgia, Hx Asbestos Exposure, CAD/ MI/ LBBB/ AICD, CHF,  - Neb ipratropium,  CPAP auto 5-20/ [employer]    AirSense 11 AutoSet        Download-compliance 100%, AHI 9.7/ hr    Centrals> Obstructive  Pressure ranging 7.4- 11.8 Body weight today- Covid vax- 2 Moderna Flu vax-declines -----Patient is doing good, no concerns She is comfortable with her breathing and has not had acute events or respiratory infections this winter.  Son is here and helps with discussion.  She has lost weight from about 170 down to 152 pounds, which she attributes to diet  change with less fried food.  This is about what she indicates is a baseline weight for her and I asked her to discuss any further weight loss with Dr. Pete Glatter. Her CPAP machine was replaced about a month ago.  With weight loss she is having more mask leak using fullface mask.  CPAP humidifier is running dry during the night.  She has just turned it down and can try without use of humidifier at all if she does not need it.  She is heating with wood heat which is very dry and we discussed trial of supplemental humidity in bedroom during the winter.  Better mask fit may also help She mentions BP gets low at times and she may feel little lightheaded standing too quickly.  She is pending cardiology follow-up soon and will discuss this. CXR 11/01/20- IMPRESSION: No active cardiopulmonary disease.  ROS-see HPI   + = positive Constitutional:   + weight loss, night sweats, fevers, chills, fatigue, lassitude. HEENT:   No-  headaches, difficulty swallowing, tooth/dental problems, sore throat,       No-  sneezing, itching, ear ache, nasal congestion, post nasal drip,  CV:  No-   chest pain, orthopnea, PND, swelling in lower extremities, anasarca, dizziness, palpitations Resp: No-   shortness of breath with exertion or at rest.              No-   productive cough,   non-productive cough,  No- coughing up of blood.              No-   change in color of mucus.  No-  wheezing.   Skin: No-   rash or lesions. GI:  No-   heartburn, indigestion, abdominal pain, nausea, vomiting, GU:  MS:  No-   joint pain or swelling.   Neuro-     nothing unusual Psych:  No- change in mood or affect. No depression or anxiety.  No memory loss.  OBJ General- Alert, Oriented, Affect-appropriate, Distress- none acute, not obese, + wheelchair Skin- rash-none, lesions- none, excoriation- none Lymphadenopathy- none Head- atraumatic            Eyes- Gross vision intact, PERRLA, conjunctivae clear secretions            Ears-  Hearing, canals-normal            Nose- Clear, no-Septal dev, mucus, polyps, erosion, perforation             Throat- Mallampati II , mucosa clear/not dry , drainage- none, tonsils- atrophic Neck- flexible , trachea midline, no stridor , thyroid nl, carotid no bruit Chest - symmetrical excursion , unlabored           Heart/CV- RRR , no murmur , no gallop  , no rub, nl s1 s2                           - JVD- none , edema- none, stasis changes- none, varices- none           Lung- + clear, wheeze- none, cough-none, dullness-none, rub- none.            Chest wall- +ICD L chesst Abd-  Br/ Gen/ Rectal- Not done, not indicated Extrem- cyanosis- none, clubbing, none, atrophy- none, strength- nl Neuro- grossly intact to observation

## 2021-03-10 ENCOUNTER — Ambulatory Visit (INDEPENDENT_AMBULATORY_CARE_PROVIDER_SITE_OTHER): Payer: Medicare Other | Admitting: Internal Medicine

## 2021-03-10 ENCOUNTER — Telehealth: Payer: Self-pay | Admitting: Internal Medicine

## 2021-03-10 ENCOUNTER — Encounter: Payer: Self-pay | Admitting: Internal Medicine

## 2021-03-10 ENCOUNTER — Other Ambulatory Visit (HOSPITAL_COMMUNITY): Payer: Self-pay

## 2021-03-10 DIAGNOSIS — G4733 Obstructive sleep apnea (adult) (pediatric): Secondary | ICD-10-CM

## 2021-03-10 DIAGNOSIS — I5020 Unspecified systolic (congestive) heart failure: Secondary | ICD-10-CM

## 2021-03-10 NOTE — Assessment & Plan Note (Signed)
She is describing some mild orthostasis.  We discussed sitting on edge of bed and avoiding sudden standing.  She will review this issue with cardiology at her upcoming appointment.

## 2021-03-10 NOTE — Assessment & Plan Note (Signed)
She is recording a few central apneas.  Continues to benefit from CPAP.  Mask leak. Plan-mask fitting.  Consider supplemental humidification in bedroom during winter months.  Continue auto 5-20.

## 2021-03-10 NOTE — Patient Instructions (Signed)
Order- schedule mask fitting at sleep center  Suggest you keep Dr Pete Glatter and your cardiologist aware of your blood pressure and weight.  Ok to add humidity in your bedroom to be comfortable during the dry months with indoor heat.  Please call if we can help

## 2021-03-10 NOTE — Telephone Encounter (Signed)
Called pt in regards to BP reading. Pt reports BP 108/60 at pulmonary office.  Pt reports checks BP once every 1-2 weeks.  Advised pt to check BP 1.5 hours after medications over the weekend write readings down and show them to PCP at Monday OV.  Pt reports has an OV on Monday 03/13/21.  Pt reports feels dizzy but not often or worrisome.  Pt thanked me for call.

## 2021-03-10 NOTE — Telephone Encounter (Signed)
Patient pcp wanted her to call in to our office to let us know that her bp was 108/60 and she weighs 152lbs

## 2021-03-13 ENCOUNTER — Other Ambulatory Visit (HOSPITAL_COMMUNITY): Payer: Self-pay

## 2021-03-21 NOTE — Progress Notes (Signed)
Cardiology Office Note:    Date:  03/22/2021   ID:  Tiffany Collier, DOB [birth year], MRN [id number]  PCP:  Lajean Manes, MD   [facility] Providers Cardiologist:  Werner Lean, MD     Referring MD: Lajean Manes, MD   CC: follow up CAD  History of Present Illness:    Tiffany Collier is a 86 y.o. female with a hx of CAD with HFrEF EF 40% prior PCI to [place] in 2018 with prior care at [facility] in [place] ([place] Stressed 2019% EF 40%- Echo 30-35% in 2018), COPD and OSA on CPAP, s/p BosSCI BiV ICD.  In interim of this visit, patient called in earlier this month for low BP while at pulmonary office.  Patient notes that she is doing pretty good.   Notes that she is exercising twice a day and does reasonably well. There are no interval hospital/ED visit.   Feels betters. Has needed no PRN lasix.  No chest pain or pressure. Her index angina was acute onset SOB that woke her from sleep. No SOB/DOE and no PND/Orthopnea.  No weight gain or leg swelling. Rare palpitations mid morning two weeks prior and no or syncope .  In her office had low blood pressure with Dr. Annamaria Boots.  With this, had cold hand and feet.  Felt tired and weak.  Has seen her primary care doctors for weight loss.  She has noted that she has made some dietary changes. Labs reviewed from Dr. Felipa Eth- she had an AKI in 12/22 that resolved 03/14/21.  Past Medical History:  Diagnosis Date   Allergic rhinitis    Bilateral pleural effusion    CAD (coronary artery disease)    CHF (congestive heart failure) (HCC)    DA (degenerative arthritis)    Dyspnea    Dyspnea    Fatigue    Fibromyalgia    History of coronary artery stent placement    HTN (hypertension)    Hypothyroid    Ischemic dilated cardiomyopathy (HCC)    Lung nodule    LLL   NSTEMI (non-ST elevated myocardial infarction) (HCC)    RBBB    Sinus tachycardia    Sleep apnea     Past Surgical History:  Procedure Laterality  Date   APPENDECTOMY     tah and bso     TONSILLECTOMY      Current Medications: Current Meds  Medication Sig   aspirin 81 MG EC tablet Take 81 mg by mouth daily. Swallow whole.   B Complex Vitamins (RA B-COMPLEX) TABS daily.   carvedilol (COREG) 3.125 MG tablet Take 1 tablet (3.125 mg total) by mouth 2 (two) times daily with a meal.   Cholecalciferol (VITAMIN D) 2000 UNITS CAPS Take 1 capsule by mouth daily. Pt. Take liquid cholecalciferol 2000 iu.   ipratropium (ATROVENT) 0.02 % nebulizer solution Take 2.5 mLs (0.5 mg total) by nebulization 4 (four) times daily.   MIRALAX 17 g packet as needed.   sacubitril-valsartan (ENTRESTO) 24-26 MG Take 1 tablet by mouth 2 (two) times daily.   [DISCONTINUED] ranolazine (RANEXA) 500 MG 12 hr tablet Take 1 tablet (500 mg total) by mouth 2 (two) times daily.     Allergies:   Latex and Other   Social History   Socioeconomic History   Marital status: Married    Spouse name: Not on file   Number of children: Not on file   Years of education: Not on file   Highest education  level: Not on file  Occupational History   Occupation: retired    Comment: postal service  Tobacco Use   Smoking status: Never   Smokeless tobacco: Never  Vaping Use   Vaping Use: Never used  Substance and Sexual Activity   Alcohol use: Not on file   Drug use: Not on file   Sexual activity: Not on file  Other Topics Concern   Not on file  Social History Narrative   Not on file   Social Determinants of Health   Financial Resource Strain: Not on file  Food Insecurity: Not on file  Transportation Needs: Not on file  Physical Activity: Not on file  Stress: Not on file  Social Connections: Not on file    Family History: No history of CAD in family.  ROS:   Please see the history of present illness.     All other systems reviewed and are negative.  EKGs/Labs/Other Studies Reviewed:    The following studies were reviewed today:  EKG:   12/16/20: SR  BiBP  Transthoracic Echocardiogram: Date: 01/12/21 Results:  1. Left ventricular ejection fraction, by estimation, is 40 to 45%. Left  ventricular ejection fraction by 3D volume is 39 %. The left ventricle has  mildly decreased function. The left ventricle demonstrates regional wall  motion abnormalities (see  scoring diagram/findings for description). Left ventricular diastolic  parameters are consistent with Grade I diastolic dysfunction (impaired  relaxation). There is severe hypokinesis of the left ventricular,  basal-mid inferolateral wall.   2. Right ventricular systolic function is normal. The right ventricular  size is normal. There is normal pulmonary artery systolic pressure.   3. The mitral valve is normal in structure. Mild mitral valve  regurgitation. No evidence of mitral stenosis.   4. Tricuspid valve regurgitation is mild to moderate.   5. The aortic valve is tricuspid. There is mild calcification of the  aortic valve. There is mild thickening of the aortic valve. Aortic valve  regurgitation is mild. Aortic valve sclerosis/calcification is present,  without any evidence of aortic  stenosis.   6. The inferior vena cava is normal in size with greater than 50%  respiratory variability, suggesting right atrial pressure of 3 mmHg.   Recent Labs: No results found for requested labs within last 8760 hours.  Recent Lipid Panel No results found for: CHOL, TRIG, HDL, CHOLHDL, VLDL, LDLCALC, LDLDIRECT   Physical Exam:    VS:  Pulse 78    Ht 5' 6.5" (1.689 m)    Wt 68 kg    SpO2 97%    BMI 23.85 kg/m     BP 122/67  Wt Readings from Last 3 Encounters:  03/22/21 68 kg  03/10/21 69.1 kg  01/12/21 73 kg    Gen: no distress, elderly and in no distress  Neck: No JVD Ears:  Pilar Plate Sign Cardiac: No Rubs or Gallops, no murmur  RRR +2 radial pulses Respiratory: Clear to auscultation bilaterally, normal effort, normal  respiratory rate GI: Soft, nontender, non-distended  MS:  No  edema;  moves all extremities Integument: Skin feels warm Neuro:  At time of evaluation, alert and oriented to person/place/time/situation  Psych: Normal affect, patient feels better   ASSESSMENT:    1. S/P ICD (internal cardiac defibrillator) procedure   2. Coronary artery disease of native artery of native heart with stable angina pectoris (HCC)   3. Heart failure with reduced ejection fraction (HCC)     PLAN:    Coronary Artery Disease -  asymptomatic with prior mLAD stent COPD OSA on CPAP  Heart Failure Reduced Ejection Fraction  - NYHA class I, Stage B, euvolemic, etiology from CAD - Diuretic regimen: Lasix 20 mg PO PRN - we have discussed the pros and cons of aggressive GDMT in the context of her recent hypotension and concerns about AKI; we will not increase her medication at this but will decrease medication for GDMT presently - Coreg 3.125 for now, if return of symptomatic hypotension will stop this medication - ARNI 24/26; given her LV recovery (to mild decrease in LVEF) my hope is to keep this medication if tolerated - on ASA - will do trial off ranolazine, if return of angina, will return medication - s/p Bos Sci BiV ICD  Five months me or PA    Medication Adjustments/Labs and Tests Ordered: Current medicines are reviewed at length with the patient today.  Concerns regarding medicines are outlined above.  No orders of the defined types were placed in this encounter.  No orders of the defined types were placed in this encounter.   Patient Instructions  Medication Instructions:  Your physician has recommended you make the following change in your medication:  STOP ranolazine (Ranexa)   *If you need a refill on your cardiac medications before your next appointment, please call your pharmacy*   Lab Work: NONE If you have labs (blood work) drawn today and your tests are completely normal, you will receive your results only by: [place] (if you have  MyChart) OR A paper copy in the mail If you have any lab test that is abnormal or we need to change your treatment, we will call you to review the results.   Testing/Procedures: NONE   Follow-Up: At [facility], you and your health needs are our priority.  As part of our continuing mission to provide you with exceptional heart care, we have created designated Provider Care Teams.  These Care Teams include your primary Cardiologist (physician) and Advanced Practice Providers (APPs -  Physician Assistants and Nurse Practitioners) who all work together to provide you with the care you need, when you need it.  We recommend signing up for the patient portal called "MyChart".  Sign up information is provided on this After Visit Summary.  MyChart is used to connect with patients for Virtual Visits (Telemedicine).  Patients are able to view lab/test results, encounter notes, upcoming appointments, etc.  Non-urgent messages can be sent to your provider as well.   To learn more about what you can do with MyChart, go to [email or web address].    Your next appointment:   5 month(s)  The format for your next appointment:   In Person  Provider:   Werner Lean, MD      Signed, Werner Lean, MD  03/22/2021 11:58 AM    [place]

## 2021-03-22 ENCOUNTER — Encounter: Payer: Self-pay | Admitting: Internal Medicine

## 2021-03-22 ENCOUNTER — Ambulatory Visit (INDEPENDENT_AMBULATORY_CARE_PROVIDER_SITE_OTHER): Payer: Medicare Other | Admitting: Internal Medicine

## 2021-03-22 ENCOUNTER — Other Ambulatory Visit: Payer: Self-pay

## 2021-03-22 ENCOUNTER — Ambulatory Visit: Payer: Medicare Other | Admitting: Internal Medicine

## 2021-03-22 VITALS — HR 78 | Ht 66.5 in | Wt 150.0 lb

## 2021-03-22 DIAGNOSIS — I25118 Atherosclerotic heart disease of native coronary artery with other forms of angina pectoris: Secondary | ICD-10-CM

## 2021-03-22 DIAGNOSIS — I5020 Unspecified systolic (congestive) heart failure: Secondary | ICD-10-CM

## 2021-03-22 DIAGNOSIS — Z95810 Presence of automatic (implantable) cardiac defibrillator: Secondary | ICD-10-CM | POA: Diagnosis not present

## 2021-03-22 NOTE — Patient Instructions (Signed)
Medication Instructions:  Your physician has recommended you make the following change in your medication:  STOP ranolazine (Ranexa)   *If you need a refill on your cardiac medications before your next appointment, please call your pharmacy*   Lab Work: NONE If you have labs (blood work) drawn today and your tests are completely normal, you will receive your results only by: MyChart Message (if you have MyChart) OR A paper copy in the mail If you have any lab test that is abnormal or we need to change your treatment, we will call you to review the results.   Testing/Procedures: NONE   Follow-Up: At [facility], you and your health needs are our priority.  As part of our continuing mission to provide you with exceptional heart care, we have created designated Provider Care Teams.  These Care Teams include your primary Cardiologist (physician) and Advanced Practice Providers (APPs -  Physician Assistants and Nurse Practitioners) who all work together to provide you with the care you need, when you need it.  We recommend signing up for the patient portal called "MyChart".  Sign up information is provided on this After Visit Summary.  MyChart is used to connect with patients for Virtual Visits (Telemedicine).  Patients are able to view lab/test results, encounter notes, upcoming appointments, etc.  Non-urgent messages can be sent to your provider as well.   To learn more about what you can do with MyChart, go to [email or web address].    Your next appointment:   5 month(s)  The format for your next appointment:   In Person  Provider:   Christell Constant, MD

## 2021-03-23 ENCOUNTER — Telehealth: Payer: Self-pay | Admitting: Internal Medicine

## 2021-03-23 ENCOUNTER — Ambulatory Visit: Payer: Medicare Other | Admitting: Internal Medicine

## 2021-03-23 NOTE — Telephone Encounter (Signed)
Pt c/o medication issue:  1. Name of Medication: ranolazine   2. How are you currently taking this medication (dosage and times per day)? One in the morning and one at night   3. Are you having a reaction (difficulty breathing--STAT)? no  4. What is your medication issue? Pt says that she was told that she should slowly stop taking this medication.. she is currently taking one a day until she completes the bottle and would like to know if this is ok or if she should stop cold [place]... please advise

## 2021-03-23 NOTE — Telephone Encounter (Signed)
Called patient with pharm D's advisement. Patient verbalized understanding.

## 2021-03-23 NOTE — Telephone Encounter (Signed)
Patient may just stop Ranexa cold [place]. No need to titrate off.

## 2021-04-13 ENCOUNTER — Ambulatory Visit (INDEPENDENT_AMBULATORY_CARE_PROVIDER_SITE_OTHER): Payer: Medicare Other

## 2021-04-13 DIAGNOSIS — R002 Palpitations: Secondary | ICD-10-CM | POA: Diagnosis not present

## 2021-04-13 LAB — CUP PACEART REMOTE DEVICE CHECK
Battery Remaining Longevity: 90 mo
Battery Remaining Percentage: 100 %
Brady Statistic RA Percent Paced: 0 %
Brady Statistic RV Percent Paced: 61 %
Date Time Interrogation Session: 20230309004200
HighPow Impedance: 81 Ohm
Implantable Lead Implant Date: 20190516
Implantable Lead Implant Date: 20190516
Implantable Lead Implant Date: 20190516
Implantable Lead Location: 753858
Implantable Lead Location: 753859
Implantable Lead Location: 753860
Implantable Lead Model: 4671
Implantable Lead Model: 672
Implantable Lead Model: 7740
Implantable Lead Serial Number: 1003218
Implantable Lead Serial Number: 102576
Implantable Lead Serial Number: 818002
Implantable Pulse Generator Implant Date: 20190516
Lead Channel Impedance Value: 473 Ohm
Lead Channel Impedance Value: 699 Ohm
Lead Channel Impedance Value: 747 Ohm
Lead Channel Pacing Threshold Amplitude: 0.7 V
Lead Channel Pacing Threshold Amplitude: 0.7 V
Lead Channel Pacing Threshold Amplitude: 1.9 V
Lead Channel Pacing Threshold Pulse Width: 0.4 ms
Lead Channel Pacing Threshold Pulse Width: 0.4 ms
Lead Channel Pacing Threshold Pulse Width: 0.8 ms
Lead Channel Setting Pacing Amplitude: 2.5 V
Lead Channel Setting Pacing Amplitude: 2.5 V
Lead Channel Setting Pacing Amplitude: 2.5 V
Lead Channel Setting Pacing Pulse Width: 0.4 ms
Lead Channel Setting Pacing Pulse Width: 0.8 ms
Lead Channel Setting Sensing Sensitivity: 0.6 mV
Lead Channel Setting Sensing Sensitivity: 1 mV
Pulse Gen Serial Number: 213687

## 2021-04-26 NOTE — Progress Notes (Signed)
Remote ICD transmission.   

## 2021-05-24 ENCOUNTER — Other Ambulatory Visit (HOSPITAL_COMMUNITY): Payer: Self-pay

## 2021-06-14 ENCOUNTER — Other Ambulatory Visit (HOSPITAL_COMMUNITY): Payer: Self-pay

## 2021-06-15 ENCOUNTER — Other Ambulatory Visit (HOSPITAL_COMMUNITY): Payer: Self-pay

## 2021-07-13 ENCOUNTER — Ambulatory Visit (INDEPENDENT_AMBULATORY_CARE_PROVIDER_SITE_OTHER): Payer: Medicare Other

## 2021-07-13 DIAGNOSIS — I5020 Unspecified systolic (congestive) heart failure: Secondary | ICD-10-CM

## 2021-07-13 LAB — CUP PACEART REMOTE DEVICE CHECK
Battery Remaining Longevity: 90 mo
Battery Remaining Percentage: 100 %
Brady Statistic RA Percent Paced: 1 %
Brady Statistic RV Percent Paced: 60 %
Date Time Interrogation Session: 20230608004200
HighPow Impedance: 74 Ohm
Implantable Lead Implant Date: 20190516
Implantable Lead Implant Date: 20190516
Implantable Lead Implant Date: 20190516
Implantable Lead Location: 753858
Implantable Lead Location: 753859
Implantable Lead Location: 753860
Implantable Lead Model: 4671
Implantable Lead Model: 672
Implantable Lead Model: 7740
Implantable Lead Serial Number: 1003218
Implantable Lead Serial Number: 102576
Implantable Lead Serial Number: 818002
Implantable Pulse Generator Implant Date: 20190516
Lead Channel Impedance Value: 486 Ohm
Lead Channel Impedance Value: 676 Ohm
Lead Channel Impedance Value: 781 Ohm
Lead Channel Pacing Threshold Amplitude: 0.7 V
Lead Channel Pacing Threshold Amplitude: 0.7 V
Lead Channel Pacing Threshold Amplitude: 1.8 V
Lead Channel Pacing Threshold Pulse Width: 0.4 ms
Lead Channel Pacing Threshold Pulse Width: 0.4 ms
Lead Channel Pacing Threshold Pulse Width: 0.8 ms
Lead Channel Setting Pacing Amplitude: 2.5 V
Lead Channel Setting Pacing Amplitude: 2.5 V
Lead Channel Setting Pacing Amplitude: 2.5 V
Lead Channel Setting Pacing Pulse Width: 0.4 ms
Lead Channel Setting Pacing Pulse Width: 0.8 ms
Lead Channel Setting Sensing Sensitivity: 0.6 mV
Lead Channel Setting Sensing Sensitivity: 1 mV
Pulse Gen Serial Number: 213687

## 2021-07-21 NOTE — Progress Notes (Signed)
Remote ICD transmission.   

## 2021-08-25 ENCOUNTER — Ambulatory Visit: Payer: Medicare Other | Admitting: Internal Medicine

## 2021-09-12 ENCOUNTER — Other Ambulatory Visit (HOSPITAL_COMMUNITY): Payer: Self-pay

## 2021-09-13 ENCOUNTER — Other Ambulatory Visit (HOSPITAL_COMMUNITY): Payer: Self-pay

## 2021-09-14 ENCOUNTER — Telehealth: Payer: Self-pay

## 2021-09-14 NOTE — Telephone Encounter (Signed)
Pt called stating she is confirming that she will be at her appointment on 09/19/2021

## 2021-09-19 ENCOUNTER — Ambulatory Visit: Payer: Medicare Other | Admitting: Internal Medicine

## 2021-09-19 ENCOUNTER — Ambulatory Visit (INDEPENDENT_AMBULATORY_CARE_PROVIDER_SITE_OTHER): Payer: Medicare Other | Admitting: Internal Medicine

## 2021-09-19 ENCOUNTER — Other Ambulatory Visit (HOSPITAL_COMMUNITY): Payer: Self-pay

## 2021-09-19 ENCOUNTER — Encounter: Payer: Self-pay | Admitting: Internal Medicine

## 2021-09-19 VITALS — BP 114/60 | HR 68 | Ht 66.5 in | Wt 140.0 lb

## 2021-09-19 DIAGNOSIS — Z95810 Presence of automatic (implantable) cardiac defibrillator: Secondary | ICD-10-CM

## 2021-09-19 DIAGNOSIS — I5020 Unspecified systolic (congestive) heart failure: Secondary | ICD-10-CM

## 2021-09-19 DIAGNOSIS — I25118 Atherosclerotic heart disease of native coronary artery with other forms of angina pectoris: Secondary | ICD-10-CM | POA: Diagnosis not present

## 2021-09-19 DIAGNOSIS — G4733 Obstructive sleep apnea (adult) (pediatric): Secondary | ICD-10-CM

## 2021-09-19 DIAGNOSIS — R002 Palpitations: Secondary | ICD-10-CM | POA: Diagnosis not present

## 2021-09-19 MED ORDER — FUROSEMIDE 20 MG PO TABS
20.0000 mg | ORAL_TABLET | Freq: Every day | ORAL | 11 refills | Status: DC | PRN
  Filled 2021-09-19: qty 30, 30d supply, fill #0

## 2021-09-19 NOTE — Progress Notes (Signed)
Cardiology Office Note:    Date:  09/19/2021   ID:  Tiffany Collier, DOB November 28, 1935, MRN [id number]  PCP:  Merlene Laughter, MD   [facility] Providers Cardiologist:  Christell Constant, MD     Referring MD: Merlene Laughter, MD   CC: Follow up CAD and HF  History of Present Illness:    Tiffany Collier is a 86 y.o. female with a hx of CAD with HFrEF EF 40% prior PCI to mLAD in 2018 with prior care at [facility] in [place] ([place] Stressed 2019% EF 40%- Echo 30-35% in 2018), COPD and OSA on CPAP, s/p BosSCI BiV ICD.  2022: Patient called in earlier this month for low BP while at pulmonary office. 2023: Wanted to try coming off some medication; no angina; we stopped ranolazine (no prior ICD shocks)  Patient notes that she is doing just fine.   Since last visit notes that she sleeps pretty well.  Has no appetite but is working to not lose weight. There are no interval hospital/ED visit.   Saw PCP yesterday and got some lab work. Has some bruising but otherwise doing well.   Sister died September 07, 2021.  Has had new palpitations; no issues on remote monitors. No chest pain or pressure.  No SOB/DOE and no PND/Orthopnea.  No weight gain .  No palpitations or syncope. Saw a chiropractor and has legs adjusted with improvement in leg swelling.   Past Medical History:  Diagnosis Date   Allergic rhinitis    Bilateral pleural effusion    CAD (coronary artery disease)    CHF (congestive heart failure) (HCC)    DA (degenerative arthritis)    Dyspnea    Dyspnea    Fatigue    Fibromyalgia    History of coronary artery stent placement    HTN (hypertension)    Hypothyroid    Ischemic dilated cardiomyopathy (HCC)    Lung nodule    LLL   NSTEMI (non-ST elevated myocardial infarction) (HCC)    RBBB    Sinus tachycardia    Sleep apnea     Past Surgical History:  Procedure Laterality Date   APPENDECTOMY     tah and bso     TONSILLECTOMY      Current  Medications: Current Meds  Medication Sig   ascorbic acid (VITAMIN C) 500 MG tablet daily.   aspirin 81 MG EC tablet Take 81 mg by mouth daily. Swallow whole.   B Complex Vitamins (RA B-COMPLEX) TABS daily.   carvedilol (COREG) 3.125 MG tablet Take 1 tablet (3.125 mg total) by mouth 2 (two) times daily with a meal.   Cholecalciferol (VITAMIN D) 2000 UNITS CAPS Take 1 capsule by mouth daily. Pt. Take liquid cholecalciferol 2000 iu.   Cyanocobalamin (VITAMIN B12) 1000 MCG TBCR daily.   furosemide (LASIX) 20 MG tablet Take 1 tablet (20 mg total) by mouth daily as needed for edema (shortness of breath).   sacubitril-valsartan (ENTRESTO) 24-26 MG Take 1 tablet by mouth 2 (two) times daily.     Allergies:   Latex and Other   Social History   Socioeconomic History   Marital status: Married    Spouse name: Not on file   Number of children: Not on file   Years of education: Not on file   Highest education level: Not on file  Occupational History   Occupation: retired    Comment: postal service  Tobacco Use   Smoking status: Never  Smokeless tobacco: Never  Vaping Use   Vaping Use: Never used  Substance and Sexual Activity   Alcohol use: Not on file   Drug use: Not on file   Sexual activity: Not on file  Other Topics Concern   Not on file  Social History Narrative   Not on file   Social Determinants of Health   Financial Resource Strain: Not on file  Food Insecurity: Not on file  Transportation Needs: Not on file  Physical Activity: Not on file  Stress: Not on file  Social Connections: Not on file    Family History: No history of CAD in family.  ROS:   Please see the history of present illness.     All other systems reviewed and are negative.  EKGs/Labs/Other Studies Reviewed:    The following studies were reviewed today:  EKG:   12/16/20: SR BiBP  Transthoracic Echocardiogram: Date: 01/12/21 Results:  1. Left ventricular ejection fraction, by estimation, is 40  to 45%. Left  ventricular ejection fraction by 3D volume is 39 %. The left ventricle has  mildly decreased function. The left ventricle demonstrates regional wall  motion abnormalities (see  scoring diagram/findings for description). Left ventricular diastolic  parameters are consistent with Grade I diastolic dysfunction (impaired  relaxation). There is severe hypokinesis of the left ventricular,  basal-mid inferolateral wall.   2. Right ventricular systolic function is normal. The right ventricular  size is normal. There is normal pulmonary artery systolic pressure.   3. The mitral valve is normal in structure. Mild mitral valve  regurgitation. No evidence of mitral stenosis.   4. Tricuspid valve regurgitation is mild to moderate.   5. The aortic valve is tricuspid. There is mild calcification of the  aortic valve. There is mild thickening of the aortic valve. Aortic valve  regurgitation is mild. Aortic valve sclerosis/calcification is present,  without any evidence of aortic  stenosis.   6. The inferior vena cava is normal in size with greater than 50%  respiratory variability, suggesting right atrial pressure of 3 mmHg.    Recent Labs: No results found for requested labs within last 365 days.  Recent Lipid Panel No results found for: "CHOL", "TRIG", "HDL", "CHOLHDL", "VLDL", "LDLCALC", "LDLDIRECT"   Physical Exam:    VS:  BP 114/60   Pulse 68   Ht 5' 6.5" (1.689 m)   Wt 140 lb (63.5 kg)   SpO2 97%   BMI 22.26 kg/m     Wt Readings from Last 3 Encounters:  09/19/21 140 lb (63.5 kg)  03/22/21 150 lb (68 kg)  03/10/21 152 lb 6.4 oz (69.1 kg)    Gen: no distress,   Neck: No JVD, no carotid bruit Cardiac: No Rubs or Gallops, no Murmur, RRR +2 radial pulses Respiratory: Clear to auscultation bilaterally, normal effort, normal  respiratory rate GI: Soft, nontender, non-distended  MS: +1 bilateral ankle edema;  moves all extremities Integument: Skin feels warm Neuro:  At  time of evaluation, alert and oriented to person/place/time/situation  Psych: Normal affect, patient feels well  ASSESSMENT:    1. Coronary artery disease of native artery of native heart with stable angina pectoris (HCC)   2. S/P ICD (internal cardiac defibrillator) procedure   3. Heart failure with reduced ejection fraction (HCC)   4. Palpitations   5. Obstructive sleep apnea     PLAN:    Coronary Artery Disease - asymptomatic with prior mLAD stent COPD OSA on CPAP  Heart Failure Reduced Ejection  Fraction  - NYHA class I, Stage B, euvolemic, etiology from CAD - Diuretic regimen: Lasix 20 mg PO PRN - we have have offered trial of MRA and SGTL2i; she has had concerns about starting new medications (see prior notes);  we will not further uptitrate GDMT after SDM - Coreg 3.125  - ARNI 24/26 - on ASA mg - s/p Bos Sci BiV ICD see EP (Dr. Ladona Ridgel), her palpitations seem to be in line with grieving process with her sister; will see if we can access a remote transmission - we are awaiting labs from Dr. Pete Glatter  One year me or APP    Medication Adjustments/Labs and Tests Ordered: Current medicines are reviewed at length with the patient today.  Concerns regarding medicines are outlined above.  No orders of the defined types were placed in this encounter.  Meds ordered this encounter  Medications   furosemide (LASIX) 20 MG tablet    Sig: Take 1 tablet (20 mg total) by mouth daily as needed for edema (shortness of breath).    Dispense:  30 tablet    Refill:  11    Patient Instructions  Medication Instructions:  Your physician has recommended you make the following change in your medication:  START: furosemide (Lasix) 20 mg by mouth once daily as needed for swelling and/or shortness of breath.  *If you need a refill on your cardiac medications before your next appointment, please call your pharmacy*   Lab Work: NONE If you have labs (blood work) drawn today and your tests  are completely normal, you will receive your results only by: MyChart Message (if you have MyChart) OR A paper copy in the mail If you have any lab test that is abnormal or we need to change your treatment, we will call you to review the results.   Testing/Procedures: NONE    Follow-Up: At [facility], you and your health needs are our priority.  As part of our continuing mission to provide you with exceptional heart care, we have created designated Provider Care Teams.  These Care Teams include your primary Cardiologist (physician) and Advanced Practice Providers (APPs -  Physician Assistants and Nurse Practitioners) who all work together to provide you with the care you need, when you need it.  We recommend signing up for the patient portal called "MyChart".  Sign up information is provided on this After Visit Summary.  MyChart is used to connect with patients for Virtual Visits (Telemedicine).  Patients are able to view lab/test results, encounter notes, upcoming appointments, etc.  Non-urgent messages can be sent to your provider as well.   To learn more about what you can do with MyChart, go to [email or web address].    Your next appointment:   1 year(s)  The format for your next appointment:   In Person  Provider:   Christell Constant, MD    Important Information About Sugar         Signed, Christell Constant, MD  09/19/2021 1:45 PM    [facility]

## 2021-09-19 NOTE — Patient Instructions (Signed)
Medication Instructions:  Your physician has recommended you make the following change in your medication:  START: furosemide (Lasix) 20 mg by mouth once daily as needed for swelling and/or shortness of breath.  *If you need a refill on your cardiac medications before your next appointment, please call your pharmacy*   Lab Work: NONE If you have labs (blood work) drawn today and your tests are completely normal, you will receive your results only by: MyChart Message (if you have MyChart) OR A paper copy in the mail If you have any lab test that is abnormal or we need to change your treatment, we will call you to review the results.   Testing/Procedures: NONE    Follow-Up: At [facility], you and your health needs are our priority.  As part of our continuing mission to provide you with exceptional heart care, we have created designated Provider Care Teams.  These Care Teams include your primary Cardiologist (physician) and Advanced Practice Providers (APPs -  Physician Assistants and Nurse Practitioners) who all work together to provide you with the care you need, when you need it.  We recommend signing up for the patient portal called "MyChart".  Sign up information is provided on this After Visit Summary.  MyChart is used to connect with patients for Virtual Visits (Telemedicine).  Patients are able to view lab/test results, encounter notes, upcoming appointments, etc.  Non-urgent messages can be sent to your provider as well.   To learn more about what you can do with MyChart, go to [email or web address].    Your next appointment:   1 year(s)  The format for your next appointment:   In Person  Provider:   Christell Constant, MD    Important Information About Sugar

## 2021-10-12 ENCOUNTER — Ambulatory Visit (INDEPENDENT_AMBULATORY_CARE_PROVIDER_SITE_OTHER): Payer: Medicare Other

## 2021-10-12 DIAGNOSIS — Z95810 Presence of automatic (implantable) cardiac defibrillator: Secondary | ICD-10-CM

## 2021-10-12 LAB — CUP PACEART REMOTE DEVICE CHECK
Battery Remaining Longevity: 90 mo
Battery Remaining Percentage: 100 %
Brady Statistic RA Percent Paced: 2 %
Brady Statistic RV Percent Paced: 65 %
Date Time Interrogation Session: 20230907004100
HighPow Impedance: 80 Ohm
Implantable Lead Implant Date: 20190516
Implantable Lead Implant Date: 20190516
Implantable Lead Implant Date: 20190516
Implantable Lead Location: 753858
Implantable Lead Location: 753859
Implantable Lead Location: 753860
Implantable Lead Model: 4671
Implantable Lead Model: 672
Implantable Lead Model: 7740
Implantable Lead Serial Number: 1003218
Implantable Lead Serial Number: 102576
Implantable Lead Serial Number: 818002
Implantable Pulse Generator Implant Date: 20190516
Lead Channel Impedance Value: 535 Ohm
Lead Channel Impedance Value: 717 Ohm
Lead Channel Impedance Value: 788 Ohm
Lead Channel Pacing Threshold Amplitude: 0.6 V
Lead Channel Pacing Threshold Amplitude: 0.6 V
Lead Channel Pacing Threshold Amplitude: 1.4 V
Lead Channel Pacing Threshold Pulse Width: 0.4 ms
Lead Channel Pacing Threshold Pulse Width: 0.4 ms
Lead Channel Pacing Threshold Pulse Width: 0.8 ms
Lead Channel Setting Pacing Amplitude: 2.5 V
Lead Channel Setting Pacing Amplitude: 2.5 V
Lead Channel Setting Pacing Amplitude: 2.5 V
Lead Channel Setting Pacing Pulse Width: 0.4 ms
Lead Channel Setting Pacing Pulse Width: 0.8 ms
Lead Channel Setting Sensing Sensitivity: 0.6 mV
Lead Channel Setting Sensing Sensitivity: 1 mV
Pulse Gen Serial Number: 213687

## 2021-11-01 ENCOUNTER — Ambulatory Visit: Payer: Medicare Other | Admitting: Internal Medicine

## 2021-11-02 NOTE — Progress Notes (Signed)
Remote ICD transmission.   

## 2021-12-13 ENCOUNTER — Other Ambulatory Visit: Payer: Self-pay | Admitting: Internal Medicine

## 2021-12-13 ENCOUNTER — Other Ambulatory Visit (HOSPITAL_COMMUNITY): Payer: Self-pay

## 2021-12-13 MED ORDER — CARVEDILOL 3.125 MG PO TABS
3.1250 mg | ORAL_TABLET | Freq: Two times a day (BID) | ORAL | 2 refills | Status: DC
  Filled 2021-12-13: qty 180, 90d supply, fill #0
  Filled 2022-03-12: qty 180, 90d supply, fill #1
  Filled 2022-06-13: qty 180, 90d supply, fill #2

## 2021-12-13 MED ORDER — ENTRESTO 24-26 MG PO TABS
1.0000 | ORAL_TABLET | Freq: Two times a day (BID) | ORAL | 2 refills | Status: DC
  Filled 2021-12-13: qty 180, 90d supply, fill #0
  Filled 2022-03-05 – 2022-03-12 (×2): qty 180, 90d supply, fill #1
  Filled 2022-06-13: qty 180, 90d supply, fill #2

## 2021-12-18 ENCOUNTER — Other Ambulatory Visit (HOSPITAL_COMMUNITY): Payer: Self-pay

## 2022-01-08 ENCOUNTER — Encounter: Payer: Medicare Other | Admitting: Student

## 2022-01-11 ENCOUNTER — Ambulatory Visit (INDEPENDENT_AMBULATORY_CARE_PROVIDER_SITE_OTHER): Payer: Medicare Other

## 2022-01-11 DIAGNOSIS — I5020 Unspecified systolic (congestive) heart failure: Secondary | ICD-10-CM | POA: Diagnosis not present

## 2022-01-11 LAB — CUP PACEART REMOTE DEVICE CHECK
Battery Remaining Longevity: 84 mo
Battery Remaining Percentage: 97 %
Brady Statistic RA Percent Paced: 2 %
Brady Statistic RV Percent Paced: 65 %
Date Time Interrogation Session: 20231207004100
HighPow Impedance: 74 Ohm
Implantable Lead Connection Status: 753985
Implantable Lead Connection Status: 753985
Implantable Lead Connection Status: 753985
Implantable Lead Implant Date: 20190516
Implantable Lead Implant Date: 20190516
Implantable Lead Implant Date: 20190516
Implantable Lead Location: 753858
Implantable Lead Location: 753859
Implantable Lead Location: 753860
Implantable Lead Model: 4671
Implantable Lead Model: 672
Implantable Lead Model: 7740
Implantable Lead Serial Number: 1003218
Implantable Lead Serial Number: 102576
Implantable Lead Serial Number: 818002
Implantable Pulse Generator Implant Date: 20190516
Lead Channel Impedance Value: 447 Ohm
Lead Channel Impedance Value: 682 Ohm
Lead Channel Impedance Value: 772 Ohm
Lead Channel Pacing Threshold Amplitude: 0.5 V
Lead Channel Pacing Threshold Amplitude: 0.6 V
Lead Channel Pacing Threshold Amplitude: 1.2 V
Lead Channel Pacing Threshold Pulse Width: 0.4 ms
Lead Channel Pacing Threshold Pulse Width: 0.4 ms
Lead Channel Pacing Threshold Pulse Width: 0.8 ms
Lead Channel Setting Pacing Amplitude: 2.5 V
Lead Channel Setting Pacing Amplitude: 2.5 V
Lead Channel Setting Pacing Amplitude: 2.5 V
Lead Channel Setting Pacing Pulse Width: 0.4 ms
Lead Channel Setting Pacing Pulse Width: 0.8 ms
Lead Channel Setting Sensing Sensitivity: 0.6 mV
Lead Channel Setting Sensing Sensitivity: 1 mV
Pulse Gen Serial Number: 213687

## 2022-02-02 NOTE — Progress Notes (Signed)
Remote ICD transmission.   

## 2022-02-26 ENCOUNTER — Encounter: Payer: Medicare Other | Admitting: Student

## 2022-03-05 ENCOUNTER — Other Ambulatory Visit (HOSPITAL_COMMUNITY): Payer: Self-pay

## 2022-03-12 ENCOUNTER — Other Ambulatory Visit (HOSPITAL_COMMUNITY): Payer: Self-pay

## 2022-03-16 ENCOUNTER — Ambulatory Visit: Payer: Medicare Other | Admitting: Internal Medicine

## 2022-03-20 ENCOUNTER — Other Ambulatory Visit (HOSPITAL_COMMUNITY): Payer: Self-pay

## 2022-04-09 ENCOUNTER — Ambulatory Visit: Payer: Medicare Other | Admitting: Internal Medicine

## 2022-04-09 ENCOUNTER — Encounter: Payer: Medicare Other | Admitting: Student

## 2022-04-12 ENCOUNTER — Ambulatory Visit: Payer: Medicare Other

## 2022-04-12 DIAGNOSIS — I5020 Unspecified systolic (congestive) heart failure: Secondary | ICD-10-CM | POA: Diagnosis not present

## 2022-04-12 LAB — CUP PACEART REMOTE DEVICE CHECK
Battery Remaining Longevity: 84 mo
Battery Remaining Percentage: 94 %
Brady Statistic RA Percent Paced: 2 %
Brady Statistic RV Percent Paced: 66 %
Date Time Interrogation Session: 20240307004000
HighPow Impedance: 82 Ohm
Implantable Lead Connection Status: 753985
Implantable Lead Connection Status: 753985
Implantable Lead Connection Status: 753985
Implantable Lead Implant Date: 20190516
Implantable Lead Implant Date: 20190516
Implantable Lead Implant Date: 20190516
Implantable Lead Location: 753858
Implantable Lead Location: 753859
Implantable Lead Location: 753860
Implantable Lead Model: 4671
Implantable Lead Model: 672
Implantable Lead Model: 7740
Implantable Lead Serial Number: 1003218
Implantable Lead Serial Number: 102576
Implantable Lead Serial Number: 818002
Implantable Pulse Generator Implant Date: 20190516
Lead Channel Impedance Value: 471 Ohm
Lead Channel Impedance Value: 703 Ohm
Lead Channel Impedance Value: 766 Ohm
Lead Channel Pacing Threshold Amplitude: 0.5 V
Lead Channel Pacing Threshold Amplitude: 0.6 V
Lead Channel Pacing Threshold Amplitude: 1.5 V
Lead Channel Pacing Threshold Pulse Width: 0.4 ms
Lead Channel Pacing Threshold Pulse Width: 0.4 ms
Lead Channel Pacing Threshold Pulse Width: 0.8 ms
Lead Channel Setting Pacing Amplitude: 2.5 V
Lead Channel Setting Pacing Amplitude: 2.5 V
Lead Channel Setting Pacing Amplitude: 2.5 V
Lead Channel Setting Pacing Pulse Width: 0.4 ms
Lead Channel Setting Pacing Pulse Width: 0.8 ms
Lead Channel Setting Sensing Sensitivity: 0.6 mV
Lead Channel Setting Sensing Sensitivity: 1 mV
Pulse Gen Serial Number: 213687

## 2022-05-15 NOTE — Progress Notes (Signed)
Remote ICD transmission.   

## 2022-06-13 ENCOUNTER — Other Ambulatory Visit (HOSPITAL_COMMUNITY): Payer: Self-pay

## 2022-07-12 ENCOUNTER — Ambulatory Visit (INDEPENDENT_AMBULATORY_CARE_PROVIDER_SITE_OTHER): Payer: Medicare Other

## 2022-07-12 DIAGNOSIS — I5020 Unspecified systolic (congestive) heart failure: Secondary | ICD-10-CM | POA: Diagnosis not present

## 2022-07-12 LAB — CUP PACEART REMOTE DEVICE CHECK
Battery Remaining Longevity: 78 mo
Battery Remaining Percentage: 91 %
Brady Statistic RA Percent Paced: 2 %
Brady Statistic RV Percent Paced: 67 %
Date Time Interrogation Session: 20240606004200
HighPow Impedance: 70 Ohm
Implantable Lead Connection Status: 753985
Implantable Lead Connection Status: 753985
Implantable Lead Connection Status: 753985
Implantable Lead Implant Date: 20190516
Implantable Lead Implant Date: 20190516
Implantable Lead Implant Date: 20190516
Implantable Lead Location: 753858
Implantable Lead Location: 753859
Implantable Lead Location: 753860
Implantable Lead Model: 4671
Implantable Lead Model: 672
Implantable Lead Model: 7740
Implantable Lead Serial Number: 1003218
Implantable Lead Serial Number: 102576
Implantable Lead Serial Number: 818002
Implantable Pulse Generator Implant Date: 20190516
Lead Channel Impedance Value: 427 Ohm
Lead Channel Impedance Value: 674 Ohm
Lead Channel Impedance Value: 742 Ohm
Lead Channel Pacing Threshold Amplitude: 0.5 V
Lead Channel Pacing Threshold Amplitude: 0.6 V
Lead Channel Pacing Threshold Amplitude: 1.5 V
Lead Channel Pacing Threshold Pulse Width: 0.4 ms
Lead Channel Pacing Threshold Pulse Width: 0.4 ms
Lead Channel Pacing Threshold Pulse Width: 0.8 ms
Lead Channel Setting Pacing Amplitude: 2.5 V
Lead Channel Setting Pacing Amplitude: 2.5 V
Lead Channel Setting Pacing Amplitude: 2.5 V
Lead Channel Setting Pacing Pulse Width: 0.4 ms
Lead Channel Setting Pacing Pulse Width: 0.8 ms
Lead Channel Setting Sensing Sensitivity: 0.6 mV
Lead Channel Setting Sensing Sensitivity: 1 mV
Pulse Gen Serial Number: 213687

## 2022-07-30 ENCOUNTER — Telehealth: Payer: Self-pay | Admitting: Cardiovascular Disease

## 2022-07-30 DIAGNOSIS — I5020 Unspecified systolic (congestive) heart failure: Secondary | ICD-10-CM

## 2022-07-30 NOTE — Telephone Encounter (Signed)
Called and spoke with patient.  Patient states she would like to know if Dr. Izora Ribas could take her off either carvedilol or Entresto. She states she has spoken with him in the past about this and states he told her he would work with her on trying to come off of some of her medications. She would like to get started on that now if possible.  Patient reports she has developed an occasional dry cough that does not seem related to her allergies. She thinks this may be related to one of the above mentioned medications. She uses a CPAP, states she sleeps "pretty good" at night but will take 1-2 naps a day ranging from 20 minutes to 2 hours. Denies any SOB or any other concerns.  Will forward to Dr. Izora Ribas to review and advise on whether or not he recommends stopping carvedilol or Entresto.

## 2022-07-30 NOTE — Telephone Encounter (Signed)
Spoke to the pt she has a question for Dr. Izora Ribas, will forward to [address] triage.

## 2022-07-30 NOTE — Telephone Encounter (Signed)
Patient was calling to talk with Dr. Antoine Poche about an important question she had

## 2022-07-30 NOTE — Telephone Encounter (Signed)
Spoke with patient and discussed Dr. Debby Bud reply:  Chronic HFrEF  With Significant COPD  - on coreg low dose and ARNI  - LVEF 40-45% would like to decrease GDMT (she was not amenable to escalation of therapy at last visit)  - stop Coreg; repeat echo in three months  - further titration based on results   Thanks,  MAC    Coreg removed from medication list.  Echo ordered, patient is requesting to have echo performed same day as appointment with Dr. Izora Ribas on 10/01/22, after the appt. Will put in scheduling comments.  Patient verbalized understanding of the above and expressed appreciation for call.

## 2022-08-01 NOTE — Progress Notes (Signed)
Remote ICD transmission.   

## 2022-08-24 ENCOUNTER — Other Ambulatory Visit (HOSPITAL_COMMUNITY): Payer: Medicare Other

## 2022-09-20 ENCOUNTER — Telehealth: Payer: Self-pay | Admitting: Internal Medicine

## 2022-09-20 ENCOUNTER — Other Ambulatory Visit (HOSPITAL_COMMUNITY): Payer: Self-pay

## 2022-09-20 MED ORDER — ENTRESTO 24-26 MG PO TABS
1.0000 | ORAL_TABLET | Freq: Two times a day (BID) | ORAL | 2 refills | Status: DC
  Filled 2022-09-20: qty 180, 90d supply, fill #0
  Filled 2022-12-20 (×2): qty 180, 90d supply, fill #1

## 2022-09-20 MED ORDER — CARVEDILOL 3.125 MG PO TABS
3.1250 mg | ORAL_TABLET | Freq: Two times a day (BID) | ORAL | 0 refills | Status: DC
  Filled 2022-09-20: qty 90, 45d supply, fill #0

## 2022-09-20 NOTE — Telephone Encounter (Signed)
Pt reports was taking entresto and carvedilol decided wanted to come off 1 med.  MD stopped carvedilol.  Pt reports since stopping med wakes up from sleep with palpitations.  Started back  taking carvedilol 3.125 mg PO once Qam and 3/125 mg Q 3-4 night.  Would like to restart med BID.  Spoke with MD is okay with pt restarting carvedilol 3.125 mg PO Bid.  Advised pt of this information.  Will send in a 90 day supply as pt is due for yearly visit this month.  Will reorder for 1 yr at OV.

## 2022-09-20 NOTE — Telephone Encounter (Signed)
*  STAT* If patient is at the pharmacy, call can be transferred to refill team.   1. Which medications need to be refilled? (please list name of each medication and dose if known) sacubitril-valsartan (ENTRESTO) 24-26 MG   2. Which pharmacy/location (including street and city if local pharmacy) is medication to be sent to? [place] [facility]   3. Do they need a 30 day or 90 day supply? 90

## 2022-09-20 NOTE — Telephone Encounter (Signed)
Pt c/o medication issue:  1. Name of Medication:    2. How are you currently taking this medication (dosage and times per day)?      3. Are you having a reaction (difficulty breathing--STAT)? no  4. What is your medication issue? Patient wants to start taking this medication again. Please advise

## 2022-09-20 NOTE — Telephone Encounter (Signed)
Left a message to call back.

## 2022-09-23 NOTE — Progress Notes (Unsigned)
HPI  female never smoker followed for OSA. Dr. Pete Glatter follows a lung nodule. Complicated by hypothyroid, restless legs, allergic rhinitis NPSG 12/21/03- AHI 31/ hr, desaturation to 87%, mild PLMS, body weight 135 lbs Normal PFT 2009. Doesn't want cough medicine. Office Spirometry 05/12/2015-normal. FEV1/FVC 0.82  ----------------------------------------------------------------------------------.   03/10/21- 87 year old female never smoker followed for OSA. Dr. Pete Glatter follows a lung nodule. Complicated by hypothyroid, restless legs, allergic rhinitis Hypothyroid, Fibromyalgia, Hx Asbestos Exposure, CAD/ MI/ LBBB/ AICD, CHF,  - Neb ipratropium,  CPAP auto 5-20/ [employer]    AirSense 11 AutoSet        Download-compliance 100%, AHI 9.7/ hr    Centrals> Obstructive  Pressure ranging 7.4- 11.8 Body weight today- Covid vax- 2 Moderna Flu vax-declines -----Patient is doing good, no concerns She is comfortable with her breathing and has not had acute events or respiratory infections this winter.  Son is here and helps with discussion.  She has lost weight from about 170 down to 152 pounds, which she attributes to diet change with less fried food.  This is about what she indicates is a baseline weight for her and I asked her to discuss any further weight loss with Dr. Pete Glatter. Her CPAP machine was replaced about a month ago.  With weight loss she is having more mask leak using fullface mask.  CPAP humidifier is running dry during the night.  She has just turned it down and can try without use of humidifier at all if she does not need it.  She is heating with wood heat which is very dry and we discussed trial of supplemental humidity in bedroom during the winter.  Better mask fit may also help She mentions BP gets low at times and she may feel little lightheaded standing too quickly.  She is pending cardiology follow-up soon and will discuss this. CXR 11/01/20- IMPRESSION: No active cardiopulmonary  disease.  09/25/22-  87 year old female never smoker followed for OSA. PCP follows a lung nodule. Complicated by hypothyroid, restless legs, allergic rhinitis Hypothyroid, Fibromyalgia, Hx Asbestos Exposure, CAD/ MI/ LBBB/ AICD, CHF,  - Neb ipratropium,  CPAP auto 5-20/ [facility]    AirSense 11 AutoSet      This machine in Sept, 2022  Download-compliance 100%, AHI 2/ hr Body weight today-133 lbs ------States machine doesn't seem to be using much water. She has called [facility] with not much help Download reviewed. Comfortable with her CPAP. Settings ok. Discussed humidifier. She can adjust for comfort. Cardiology evaluating for palpitations (Has ICD)  ROS-see HPI   + = positive Constitutional:   + weight loss, night sweats, fevers, chills, fatigue, lassitude. HEENT:   No-  headaches, difficulty swallowing, tooth/dental problems, sore throat,       No-  sneezing, itching, ear ache, nasal congestion, post nasal drip,  CV:  No-   chest pain, orthopnea, PND, swelling in lower extremities, anasarca, dizziness, palpitations Resp: No-   shortness of breath with exertion or at rest.              No-   productive cough,   non-productive cough,  No- coughing up of blood.              No-   change in color of mucus.  No- wheezing.   Skin: No-   rash or lesions. GI:  No-   heartburn, indigestion, abdominal pain, nausea, vomiting, GU:  MS:  No-   joint pain or swelling.   Neuro-     nothing unusual  Psych:  No- change in mood or affect. No depression or anxiety.  No memory loss.  OBJ  +wheelchair General- Alert, Oriented, Affect-appropriate, Distress- none acute, not obese, + wheelchair Skin- rash-none, lesions- none, excoriation- none Lymphadenopathy- none Head- atraumatic            Eyes- Gross vision intact, PERRLA, conjunctivae clear secretions            Ears- Hearing, canals-normal            Nose- Clear, no-Septal dev, mucus, polyps, erosion, perforation             Throat- Mallampati II , mucosa  clear/not dry , drainage- none, tonsils- atrophic Neck- flexible , trachea midline, no stridor , thyroid nl, carotid no bruit Chest - symmetrical excursion , unlabored           Heart/CV- RRR , no murmur , no gallop  , no rub, nl s1 s2                           - JVD- none , edema- none, stasis changes- none, varices- none           Lung- + clear, wheeze- none, cough-none, dullness-none, rub- none.            Chest wall- +ICD L chest Abd-  Br/ Gen/ Rectal- Not done, not indicated Extrem- cyanosis- none, clubbing, none, atrophy- none, strength- nl Neuro- grossly intact to observation

## 2022-09-25 ENCOUNTER — Ambulatory Visit (HOSPITAL_BASED_OUTPATIENT_CLINIC_OR_DEPARTMENT_OTHER): Payer: Medicare Other

## 2022-09-25 ENCOUNTER — Encounter: Payer: Self-pay | Admitting: Internal Medicine

## 2022-09-25 ENCOUNTER — Ambulatory Visit (INDEPENDENT_AMBULATORY_CARE_PROVIDER_SITE_OTHER): Payer: Medicare Other | Admitting: Internal Medicine

## 2022-09-25 ENCOUNTER — Other Ambulatory Visit (HOSPITAL_COMMUNITY): Payer: Self-pay

## 2022-09-25 ENCOUNTER — Ambulatory Visit: Payer: Medicare Other | Attending: Student | Admitting: Internal Medicine

## 2022-09-25 VITALS — BP 110/70 | HR 70 | Ht 66.25 in | Wt 133.4 lb

## 2022-09-25 VITALS — BP 128/68 | HR 72 | Ht 66.5 in | Wt 133.0 lb

## 2022-09-25 DIAGNOSIS — R002 Palpitations: Secondary | ICD-10-CM | POA: Diagnosis present

## 2022-09-25 DIAGNOSIS — G4733 Obstructive sleep apnea (adult) (pediatric): Secondary | ICD-10-CM | POA: Diagnosis not present

## 2022-09-25 DIAGNOSIS — I5020 Unspecified systolic (congestive) heart failure: Secondary | ICD-10-CM | POA: Insufficient documentation

## 2022-09-25 LAB — ECHOCARDIOGRAM COMPLETE
Area-P 1/2: 3.02 cm2
P 1/2 time: 463 msec
S' Lateral: 3.2 cm

## 2022-09-25 NOTE — Assessment & Plan Note (Signed)
Benefits from CPAP with good compliance and control Plan- contnue auto 5-20

## 2022-09-25 NOTE — Assessment & Plan Note (Signed)
Cardiology following. Has ICD

## 2022-09-25 NOTE — Patient Instructions (Signed)
We can continue CPAP auto 5-20. It is ok for you to adjust the humidifier- you have the instruction booklet that came with the machine.  Please cal if we can help

## 2022-09-25 NOTE — Progress Notes (Signed)
HPI Tiffany Collier is referred by Dr. Izora Ribas for evaluation of and ongoing management of her ICD. She is a pleasant 87 yo woman with CAD s/p MI, first in [place] and then in [place]. The patient has been placed on GDMT. She underwent insertion of a [employer] Biv ICD in 2019. She denies any ICD therapies. No chest pain or sob. Mild peripheral edema.  Allergies  Allergen Reactions   Latex    Other     CIGARETTE SMOKE, PERFUME, AEROSOLS AND ALL HOUSEHOLD CLEANERS     Current Outpatient Medications  Medication Sig Dispense Refill   ascorbic acid (VITAMIN C) 500 MG tablet daily.     aspirin 81 MG EC tablet Take 81 mg by mouth daily. Swallow whole.     B Complex Vitamins (RA B-COMPLEX) TABS daily.     carvedilol (COREG) 3.125 MG tablet Take 1 tablet (3.125 mg total) by mouth 2 (two) times daily. 90 tablet 0   Cholecalciferol (VITAMIN D) 2000 UNITS CAPS Take 1 capsule by mouth daily. Pt. Take liquid cholecalciferol 2000 iu.     Cyanocobalamin (VITAMIN B12) 1000 MCG TBCR daily.     furosemide (LASIX) 20 MG tablet Take 1 tablet (20 mg total) by mouth daily as needed for edema (shortness of breath). 30 tablet 11   sacubitril-valsartan (ENTRESTO) 24-26 MG Take 1 tablet by mouth 2 (two) times daily. Please call office to schedule an appt for further refills. Thank you 180 tablet 2   No current facility-administered medications for this visit.     Past Medical History:  Diagnosis Date   Allergic rhinitis    Bilateral pleural effusion    CAD (coronary artery disease)    CHF (congestive heart failure) (HCC)    DA (degenerative arthritis)    Dyspnea    Dyspnea    Fatigue    Fibromyalgia    History of coronary artery stent placement    HTN (hypertension)    Hypothyroid    Ischemic dilated cardiomyopathy (HCC)    Lung nodule    LLL   NSTEMI (non-ST elevated myocardial infarction) (HCC)    RBBB    Sinus tachycardia    Sleep apnea     ROS:   All systems reviewed and  negative except as noted in the HPI.   Past Surgical History:  Procedure Laterality Date   APPENDECTOMY     tah and bso     TONSILLECTOMY       History reviewed. No pertinent family history.   Social History   Socioeconomic History   Marital status: Married    Spouse name: Not on file   Number of children: Not on file   Years of education: Not on file   Highest education level: Not on file  Occupational History   Occupation: retired    Comment: postal service  Tobacco Use   Smoking status: Never   Smokeless tobacco: Never  Vaping Use   Vaping status: Never Used  Substance and Sexual Activity   Alcohol use: Not on file   Drug use: Not on file   Sexual activity: Not on file  Other Topics Concern   Not on file  Social History Narrative   Not on file   Social Determinants of Health   Financial Resource Strain: Not on file  Food Insecurity: Not on file  Transportation Needs: Not on file  Physical Activity: Not on file  Stress: Not on file  Social Connections: Not on  file  Intimate Partner Violence: Not on file     BP 128/68   Pulse 72   Ht 5' 6.5" (1.689 m)   Wt 133 lb (60.3 kg)   SpO2 96%   BMI 21.15 kg/m   Physical Exam:  Well appearing NAD HEENT: Unremarkable Neck:  No JVD, no thyromegally Lymphatics:  No adenopathy Back:  No CVA tenderness Lungs:  Clear with no wheezes HEART:  Regular rate rhythm, no murmurs, no rubs, no clicks Abd:  soft, positive bowel sounds, no organomegally, no rebound, no guarding Ext:  2 plus pulses, no edema, no cyanosis, no clubbing Skin:  No rashes no nodules Neuro:  CN II through XII intact, motor grossly intact  EKG -   DEVICE  Normal device function.  See PaceArt for details.   Assess/Plan:  Chronic systolic heart failure - her symptoms are class 2 with biv pacing. She will continue her GDMT. ICD - her [employer] biv ICD is working normally. We will follow. CAD - she is s/p MI and has no additional anginal  symptoms.   Tiffany Gowda Memorie Yokoyama,MD

## 2022-09-25 NOTE — Patient Instructions (Signed)

## 2022-09-26 LAB — CUP PACEART INCLINIC DEVICE CHECK
Date Time Interrogation Session: 20240820000000
HighPow Impedance: 80 Ohm
Implantable Lead Connection Status: 753985
Implantable Lead Connection Status: 753985
Implantable Lead Connection Status: 753985
Implantable Lead Implant Date: 20190516
Implantable Lead Implant Date: 20190516
Implantable Lead Implant Date: 20190516
Implantable Lead Location: 753858
Implantable Lead Location: 753859
Implantable Lead Location: 753860
Implantable Lead Model: 4671
Implantable Lead Model: 672
Implantable Lead Model: 7740
Implantable Lead Serial Number: 1003218
Implantable Lead Serial Number: 102576
Implantable Lead Serial Number: 818002
Implantable Pulse Generator Implant Date: 20190516
Lead Channel Impedance Value: 480 Ohm
Lead Channel Impedance Value: 698 Ohm
Lead Channel Impedance Value: 761 Ohm
Lead Channel Pacing Threshold Amplitude: 0.5 V
Lead Channel Pacing Threshold Amplitude: 0.7 V
Lead Channel Pacing Threshold Amplitude: 1.5 V
Lead Channel Pacing Threshold Pulse Width: 0.4 ms
Lead Channel Pacing Threshold Pulse Width: 0.4 ms
Lead Channel Pacing Threshold Pulse Width: 0.8 ms
Lead Channel Sensing Intrinsic Amplitude: 11.6 mV
Lead Channel Sensing Intrinsic Amplitude: 17.6 mV
Lead Channel Sensing Intrinsic Amplitude: 4.1 mV
Lead Channel Setting Pacing Amplitude: 2.5 V
Lead Channel Setting Pacing Amplitude: 2.5 V
Lead Channel Setting Pacing Amplitude: 2.5 V
Lead Channel Setting Pacing Pulse Width: 0.4 ms
Lead Channel Setting Pacing Pulse Width: 0.8 ms
Lead Channel Setting Sensing Sensitivity: 0.6 mV
Lead Channel Setting Sensing Sensitivity: 1 mV
Pulse Gen Serial Number: 213687

## 2022-09-30 NOTE — Progress Notes (Unsigned)
Cardiology Office Note:    Date:  10/01/2022   ID:  Tiffany Collier, DOB [birth year], MRN [id number]  PCP:  Collene Mares, PA   [facility] Providers Cardiologist:  Christell Constant, MD Electrophysiologist:  Lewayne Bunting, MD     Referring MD: Merlene Laughter, MD   CC: Follow up CAD and HF  History of Present Illness:    Tiffany Collier is a 87 y.o. female with a hx of CAD with HFrEF EF 40% prior PCI to mLAD in 2016-10-11 with prior care at [facility] in [place] ([place] Stressed 2019% EF 40%- Echo 30-35% in 10/11/2016), COPD and OSA on CPAP, s/p BosSCI BiV ICD.  10/11/2020: Patient called in earlier this month for low BP while at pulmonary office. 10-11-21: Wanted to try coming off some medication; no angina; we stopped ranolazine (no prior ICD shocks).  Sister died in October 11, 2021 10-12-22: Doing well with EP.  MR has increased  Patient notes that she is doing well.   Since last visit notes that she does not want to be on any new medications. She wants to come off of the carvedilol.  She has a cough.  It improves off the cough.  There are no interval hospital/ED visit.   She has worked through some grief about her sister.  No chest pain or pressure .  No SOB/DOE and no PND/Orthopnea.  No weight gain or leg swelling.  No palpitations or syncope .   Past Medical History:  Diagnosis Date   Allergic rhinitis    Bilateral pleural effusion    CAD (coronary artery disease)    CHF (congestive heart failure) (HCC)    DA (degenerative arthritis)    Dyspnea    Dyspnea    Fatigue    Fibromyalgia    History of coronary artery stent placement    HTN (hypertension)    Hypothyroid    Ischemic dilated cardiomyopathy (HCC)    Lung nodule    LLL   NSTEMI (non-ST elevated myocardial infarction) (HCC)    RBBB    Sinus tachycardia    Sleep apnea     Past Surgical History:  Procedure Laterality Date   APPENDECTOMY     tah and bso     TONSILLECTOMY      Current Medications: Current  Meds  Medication Sig   ascorbic acid (VITAMIN C) 500 MG tablet daily.   aspirin 81 MG EC tablet Take 81 mg by mouth daily. Swallow whole.   B Complex Vitamins (RA B-COMPLEX) TABS daily.   bisoprolol (ZEBETA) 5 MG tablet Take 0.5 tablets (2.5 mg total) by mouth daily.   Cholecalciferol (VITAMIN D) 2000 UNITS CAPS Take 1 capsule by mouth daily. Pt. Take liquid cholecalciferol 2000 iu.   Cyanocobalamin (VITAMIN B12) 1000 MCG TBCR daily.   furosemide (LASIX) 20 MG tablet Take 1 tablet (20 mg total) by mouth daily as needed for edema (shortness of breath).   sacubitril-valsartan (ENTRESTO) 24-26 MG Take 1 tablet by mouth 2 (two) times daily. Please call office to schedule an appt for further refills. Thank you   [DISCONTINUED] carvedilol (COREG) 3.125 MG tablet Take 1 tablet (3.125 mg total) by mouth 2 (two) times daily.     Allergies:   Latex and Other   Social History   Socioeconomic History   Marital status: Married    Spouse name: Not on file   Number of children: Not on file   Years of education: Not on file  Highest education level: Not on file  Occupational History   Occupation: retired    Comment: postal service  Tobacco Use   Smoking status: Never   Smokeless tobacco: Never  Vaping Use   Vaping status: Never Used  Substance and Sexual Activity   Alcohol use: Not on file   Drug use: Not on file   Sexual activity: Not on file  Other Topics Concern   Not on file  Social History Narrative   Not on file   Social Determinants of Health   Financial Resource Strain: Not on file  Food Insecurity: Not on file  Transportation Needs: Not on file  Physical Activity: Not on file  Stress: Not on file  Social Connections: Not on file    Family History: No history of CAD in family.  ROS:   Please see the history of present illness.     EKGs/Labs/Other Studies Reviewed:    The following studies were reviewed today:  Cardiac Studies & Procedures        ECHOCARDIOGRAM  ECHOCARDIOGRAM COMPLETE 09/25/2022  Narrative ECHOCARDIOGRAM REPORT    Patient Name:   Tiffany Collier Date of Exam: 09/25/2022 Medical Rec #:  [id number]     Height:       66.5 in Accession #:    [id number]    Weight:       140.0 lb Date of Birth:  [birth year]     BSA:          1.728 m Patient Age:    87 years      BP:           114/60 mmHg Patient Gender: F             HR:           69 bpm. Exam Location:  [address]  Procedure: 2D Echo, Cardiac Doppler and Color Doppler  Indications:    I50.20 CHF  History:        Patient has prior history of Echocardiogram examinations, most recent 01/12/2021. CHF and Ischemic cardiomyopathy, Previous Myocardial Infarction and CAD, ICD, COPD, Arrythmias:Tachycardia and RBBB; Risk Factors:Hypertension.  Sonographer:    Samule Ohm RDCS Referring Phys: [id number] [facility] A Izora Ribas   Sonographer Comments: Technically difficult study due to poor echo windows. IMPRESSIONS   1. Left ventricular ejection fraction, by estimation, is 40 to 45%. The left ventricle has mildly decreased function. The left ventricle demonstrates regional wall motion abnormalities (see scoring diagram/findings for description). There is mild concentric left ventricular hypertrophy. Left ventricular diastolic parameters are indeterminate. 2. Right ventricular systolic function is normal. The right ventricular size is normal. 3. A small pericardial effusion is present. The pericardial effusion is localized near the right ventricle. 4. The mitral valve is normal in structure. Mild to moderate mitral valve regurgitation. No evidence of mitral stenosis. 5. Tricuspid valve regurgitation is mild to moderate. 6. The aortic valve is calcified. There is mild calcification of the aortic valve. Aortic valve regurgitation is mild. Aortic valve sclerosis/calcification is present, without any evidence of aortic stenosis. 7. The inferior vena cava is normal in size  with greater than 50% respiratory variability, suggesting right atrial pressure of 3 mmHg.  FINDINGS Left Ventricle: Left ventricular ejection fraction, by estimation, is 40 to 45%. The left ventricle has mildly decreased function. The left ventricle demonstrates regional wall motion abnormalities. The left ventricular internal cavity size was normal in size. There is mild concentric left ventricular hypertrophy. Left ventricular diastolic parameters are indeterminate.  LV Wall Scoring: The posterior wall is akinetic. The entire anterior wall, antero-lateral wall, entire septum, entire apex, and entire inferior wall are hypokinetic.  Right Ventricle: The right ventricular size is normal. No increase in right ventricular wall thickness. Right ventricular systolic function is normal.  Left Atrium: Left atrial size was normal in size.  Right Atrium: Right atrial size was normal in size.  Pericardium: A small pericardial effusion is present. The pericardial effusion is localized near the right ventricle.  Mitral Valve: The mitral valve is normal in structure. Mild to moderate mitral valve regurgitation. No evidence of mitral valve stenosis.  Tricuspid Valve: The tricuspid valve is normal in structure. Tricuspid valve regurgitation is mild to moderate. No evidence of tricuspid stenosis.  Aortic Valve: The aortic valve is calcified. There is mild calcification of the aortic valve. There is moderate aortic valve annular calcification. Aortic valve regurgitation is mild. Aortic regurgitation PHT measures 463 msec. Aortic valve sclerosis/calcification is present, without any evidence of aortic stenosis.  Pulmonic Valve: The pulmonic valve was normal in structure. Pulmonic valve regurgitation is not visualized. No evidence of pulmonic stenosis.  Aorta: The aortic root is normal in size and structure.  Venous: The inferior vena cava is normal in size with greater than 50% respiratory variability,  suggesting right atrial pressure of 3 mmHg.  IAS/Shunts: No atrial level shunt detected by color flow Doppler.  Additional Comments: A device lead is visualized.   LEFT VENTRICLE PLAX 2D LVIDd:         3.90 cm   Diastology LVIDs:         3.20 cm   LV e' medial:    7.18 cm/s LV PW:         1.10 cm   LV E/e' medial:  11.8 LV IVS:        0.90 cm   LV e' lateral:   12.20 cm/s LVOT diam:     1.90 cm   LV E/e' lateral: 7.0 LV SV:         43 LV SV Index:   25 LVOT Area:     2.84 cm   RIGHT VENTRICLE            IVC RV S prime:     9.36 cm/s  IVC diam: 1.60 cm TAPSE (M-mode): 1.6 cm RVSP:           20.1 mmHg  LEFT ATRIUM             Index        RIGHT ATRIUM           Index LA diam:        4.00 cm 2.31 cm/m   RA Pressure: 3.00 mmHg LA Vol (A2C):   44.3 ml 25.63 ml/m  RA Area:     12.60 cm LA Vol (A4C):   26.7 ml 15.45 ml/m  RA Volume:   31.80 ml  18.40 ml/m LA Biplane Vol: 34.8 ml 20.14 ml/m AORTIC VALVE LVOT Vmax:   72.70 cm/s LVOT Vmean:  53.600 cm/s LVOT VTI:    0.152 m AI PHT:      463 msec  AORTA Ao Root diam: 2.95 cm  MITRAL VALVE               TRICUSPID VALVE MV Area (PHT): 3.02 cm    TR Peak grad:   17.1 mmHg MV Decel Time: 251 msec    TR Vmax:        207.00  cm/s MV E velocity: 84.90 cm/s  Estimated RAP:  3.00 mmHg MV A velocity: 81.80 cm/s  RVSP:           20.1 mmHg MV E/A ratio:  1.04 SHUNTS Systemic VTI:  0.15 m Systemic Diam: 1.90 cm  Kardie Tobb DO Electronically signed by Thomasene Ripple DO Signature Date/Time: 09/25/2022/12:27:14 PM    Final             Physical Exam:    VS:  BP 112/60   Pulse 79   Ht 5\' 6"  (1.676 m)   Wt 132 lb (59.9 kg)   SpO2 96%   BMI 21.31 kg/m     Wt Readings from Last 3 Encounters:  10/01/22 132 lb (59.9 kg)  09/25/22 133 lb (60.3 kg)  09/25/22 133 lb 6.4 oz (60.5 kg)    Gen: no distress,  elderly female Neck: No JVD Cardiac: No Rubs or Gallops, Holosystolic murmur, RRR +2 radial pulses Respiratory: Clear to  auscultation bilaterally, normal effort, normal  respiratory rate GI: Soft, nontender, non-distended  MS: trace edema;  moves all extremities Integument: Skin feels warm Neuro:  At time of evaluation, alert and oriented to person/place/time/situation  Psych: Normal affect, patient feels well  ASSESSMENT:    1. Heart failure with reduced ejection fraction (HCC)   2. Coronary artery disease of native artery of native heart with stable angina pectoris (HCC)   3. S/P ICD (internal cardiac defibrillator) procedure   4. Non-rheumatic mitral regurgitation   5. Obstructive sleep apnea   6. Chronic cough   7. Chronic obstructive pulmonary disease, unspecified COPD type (HCC)     PLAN:    Heart Failure Reduced Ejection Fraction  Mild to moderate MR - NYHA class I, Stage B, euvolemic, etiology from CAD - Diuretic regimen: Lasix 20 mg PO PRN - we have have offered trial of MRA and SGTL2i (see prior notes) she has deferred - given her COPD, we will stop COREG for bisoprolol, 2.5 mg; we will give 30 day supply one refill; she will check in on her cough and dizziness after this; if still having sx will stop BB - continue ARNI - echo in one year  Coronary Artery Disease - asymptomatic with prior mLAD stent - on ASA mg - lipids in ~ 30 days  COPD OSA on CPAP - continue therapy    S/p Biv ICD - sees EP (Dr. Ladona Ridgel)  One year me after her echo    Medication Adjustments/Labs and Tests Ordered: Current medicines are reviewed at length with the patient today.  Concerns regarding medicines are outlined above.  Orders Placed This Encounter  Procedures   Lipid panel   ECHOCARDIOGRAM COMPLETE   Meds ordered this encounter  Medications   bisoprolol (ZEBETA) 5 MG tablet    Sig: Take 0.5 tablets (2.5 mg total) by mouth daily.    Dispense:  15 tablet    Refill:  1    Patient Instructions  Medication Instructions:  Your physician has recommended you make the following change in your  medication:  1) STOP taking Coreg (carvedilol) 2) START taking bisoprolol 2.5 mg daily *If you need a refill on your cardiac medications before your next appointment, please call your pharmacy*  Lab Work: Fasting lipids in 30 days  Testing/Procedures: Your physician has requested that you have an echocardiogram in one year. Echocardiography is a painless test that uses sound waves to create images of your heart. It provides your doctor with information about the  size and shape of your heart and how well your heart's chambers and valves are working. This procedure takes approximately one hour. There are no restrictions for this procedure. Please do NOT wear cologne, perfume, aftershave, or lotions (deodorant is allowed). Please arrive 15 minutes prior to your appointment time.  Follow-Up: At [facility], you and your health needs are our priority.  As part of our continuing mission to provide you with exceptional heart care, we have created designated Provider Care Teams.  These Care Teams include your primary Cardiologist (physician) and Advanced Practice Providers (APPs -  Physician Assistants and Nurse Practitioners) who all work together to provide you with the care you need, when you need it.  Your next appointment:   1 year  Provider:   Christell Constant, MD      Signed, Christell Constant, MD  10/01/2022 9:47 AM     [facility]

## 2022-10-01 ENCOUNTER — Other Ambulatory Visit (HOSPITAL_COMMUNITY): Payer: Self-pay

## 2022-10-01 ENCOUNTER — Ambulatory Visit: Payer: Medicare Other | Attending: Internal Medicine | Admitting: Internal Medicine

## 2022-10-01 ENCOUNTER — Encounter: Payer: Self-pay | Admitting: Internal Medicine

## 2022-10-01 VITALS — BP 112/60 | HR 79 | Ht 66.0 in | Wt 132.0 lb

## 2022-10-01 DIAGNOSIS — I5020 Unspecified systolic (congestive) heart failure: Secondary | ICD-10-CM | POA: Insufficient documentation

## 2022-10-01 DIAGNOSIS — R053 Chronic cough: Secondary | ICD-10-CM | POA: Insufficient documentation

## 2022-10-01 DIAGNOSIS — J449 Chronic obstructive pulmonary disease, unspecified: Secondary | ICD-10-CM | POA: Insufficient documentation

## 2022-10-01 DIAGNOSIS — I340 Nonrheumatic mitral (valve) insufficiency: Secondary | ICD-10-CM | POA: Insufficient documentation

## 2022-10-01 DIAGNOSIS — I25118 Atherosclerotic heart disease of native coronary artery with other forms of angina pectoris: Secondary | ICD-10-CM | POA: Insufficient documentation

## 2022-10-01 DIAGNOSIS — Z95810 Presence of automatic (implantable) cardiac defibrillator: Secondary | ICD-10-CM | POA: Insufficient documentation

## 2022-10-01 DIAGNOSIS — G4733 Obstructive sleep apnea (adult) (pediatric): Secondary | ICD-10-CM | POA: Insufficient documentation

## 2022-10-01 MED ORDER — BISOPROLOL FUMARATE 5 MG PO TABS
2.5000 mg | ORAL_TABLET | Freq: Every day | ORAL | 1 refills | Status: DC
  Filled 2022-10-01: qty 15, 30d supply, fill #0
  Filled 2023-02-21: qty 15, 30d supply, fill #1

## 2022-10-01 NOTE — Patient Instructions (Signed)
Medication Instructions:  Your physician has recommended you make the following change in your medication:  1) STOP taking Coreg (carvedilol) 2) START taking bisoprolol 2.5 mg daily *If you need a refill on your cardiac medications before your next appointment, please call your pharmacy*  Lab Work: Fasting lipids in 30 days  Testing/Procedures: Your physician has requested that you have an echocardiogram in one year. Echocardiography is a painless test that uses sound waves to create images of your heart. It provides your doctor with information about the size and shape of your heart and how well your heart's chambers and valves are working. This procedure takes approximately one hour. There are no restrictions for this procedure. Please do NOT wear cologne, perfume, aftershave, or lotions (deodorant is allowed). Please arrive 15 minutes prior to your appointment time.  Follow-Up: At [facility], you and your health needs are our priority.  As part of our continuing mission to provide you with exceptional heart care, we have created designated Provider Care Teams.  These Care Teams include your primary Cardiologist (physician) and Advanced Practice Providers (APPs -  Physician Assistants and Nurse Practitioners) who all work together to provide you with the care you need, when you need it.  Your next appointment:   1 year  Provider:   Christell Constant, MD

## 2022-10-02 ENCOUNTER — Other Ambulatory Visit: Payer: Self-pay

## 2022-10-02 DIAGNOSIS — I340 Nonrheumatic mitral (valve) insufficiency: Secondary | ICD-10-CM

## 2022-10-02 NOTE — Progress Notes (Unsigned)
Placed order for echo due in 1 yr from Aug 2024 Echo. "Results:  Similar function to prior  Slight increase in MR  Plan:  Repeat echo in one year "

## 2022-10-03 ENCOUNTER — Encounter: Payer: Self-pay | Admitting: Internal Medicine

## 2022-10-11 ENCOUNTER — Ambulatory Visit (INDEPENDENT_AMBULATORY_CARE_PROVIDER_SITE_OTHER): Payer: Medicare Other

## 2022-10-11 DIAGNOSIS — I5020 Unspecified systolic (congestive) heart failure: Secondary | ICD-10-CM | POA: Diagnosis not present

## 2022-10-11 LAB — CUP PACEART REMOTE DEVICE CHECK
Battery Remaining Longevity: 72 mo
Battery Remaining Percentage: 82 %
Brady Statistic RA Percent Paced: 2 %
Brady Statistic RV Percent Paced: 69 %
Date Time Interrogation Session: 20240905004000
HighPow Impedance: 89 Ohm
Implantable Lead Connection Status: 753985
Implantable Lead Connection Status: 753985
Implantable Lead Connection Status: 753985
Implantable Lead Implant Date: 20190516
Implantable Lead Implant Date: 20190516
Implantable Lead Implant Date: 20190516
Implantable Lead Location: 753858
Implantable Lead Location: 753859
Implantable Lead Location: 753860
Implantable Lead Model: 4671
Implantable Lead Model: 672
Implantable Lead Model: 7740
Implantable Lead Serial Number: 1003218
Implantable Lead Serial Number: 102576
Implantable Lead Serial Number: 818002
Implantable Pulse Generator Implant Date: 20190516
Lead Channel Impedance Value: 461 Ohm
Lead Channel Impedance Value: 711 Ohm
Lead Channel Impedance Value: 787 Ohm
Lead Channel Pacing Threshold Amplitude: 0.5 V
Lead Channel Pacing Threshold Amplitude: 0.7 V
Lead Channel Pacing Threshold Amplitude: 1.7 V
Lead Channel Pacing Threshold Pulse Width: 0.4 ms
Lead Channel Pacing Threshold Pulse Width: 0.4 ms
Lead Channel Pacing Threshold Pulse Width: 0.8 ms
Lead Channel Setting Pacing Amplitude: 2.5 V
Lead Channel Setting Pacing Amplitude: 2.5 V
Lead Channel Setting Pacing Amplitude: 2.5 V
Lead Channel Setting Pacing Pulse Width: 0.4 ms
Lead Channel Setting Pacing Pulse Width: 0.8 ms
Lead Channel Setting Sensing Sensitivity: 0.6 mV
Lead Channel Setting Sensing Sensitivity: 1 mV
Pulse Gen Serial Number: 213687

## 2022-10-19 NOTE — Progress Notes (Signed)
Remote ICD transmission.   

## 2022-10-22 ENCOUNTER — Other Ambulatory Visit (HOSPITAL_COMMUNITY): Payer: Self-pay

## 2022-10-29 ENCOUNTER — Telehealth: Payer: Self-pay | Admitting: Internal Medicine

## 2022-10-29 NOTE — Telephone Encounter (Signed)
Called pt in regards to lab appointment and medication management.  Reports will have labs drawn but will not take any statin medications.  When asked why pt doesn't want to take statin; female in back ground became loud and aggressive expressing pt will not be taking any cholesterol medications. Pt reports blurry vision, increase feet swelling, and cough/ trouble swallowing.  Pt wants to know if should restart carvedilol.    Advised pt not to take either medication per MD note: "given her COPD, we will stop COREG for bisoprolol, 2.5 mg; we will give 30 day supply one refill; she will check in on her cough and dizziness after this; if still having sx will stop BB - continue ARNI - echo in one year"  Will send to MD to review.

## 2022-10-29 NOTE — Telephone Encounter (Signed)
Pt is scheduled to have labs done on Friday but she stated if MD is trying to have the labs done just to put on a statin drug, she's not going to have the labs done because she'd not taking that medication so she'd requesting a call to discuss further with nurse. She also called about another medication below. Please advise    Pt c/o medication issue:  1. Name of Medication:   bisoprolol (ZEBETA) 5 MG tablet    2. How are you currently taking this medication (dosage and times per day)?  Take 1/2 tablet (2.5 mg total) by mouth daily.       3. Are you having a reaction (difficulty breathing--STAT)? No  4. What is your medication issue? Pt stated since being on this medication she's experienced some blurred vision and coughing has increased. Please advise

## 2022-11-02 ENCOUNTER — Ambulatory Visit: Payer: Medicare Other

## 2022-11-06 ENCOUNTER — Ambulatory Visit: Payer: Medicare Other | Attending: Internal Medicine

## 2022-11-06 DIAGNOSIS — I5020 Unspecified systolic (congestive) heart failure: Secondary | ICD-10-CM

## 2022-11-06 DIAGNOSIS — I25118 Atherosclerotic heart disease of native coronary artery with other forms of angina pectoris: Secondary | ICD-10-CM

## 2022-11-07 LAB — LIPID PANEL
Chol/HDL Ratio: 4.4 {ratio} (ref 0.0–4.4)
Cholesterol, Total: 285 mg/dL — ABNORMAL HIGH (ref 100–199)
HDL: 65 mg/dL (ref >39)
LDL Chol Calc (NIH): 207 mg/dL — ABNORMAL HIGH (ref 0–99)
Triglycerides: 82 mg/dL (ref 0–149)
VLDL Cholesterol Cal: 13 mg/dL (ref 5–40)

## 2022-11-09 ENCOUNTER — Telehealth: Payer: Self-pay | Admitting: Internal Medicine

## 2022-11-09 ENCOUNTER — Telehealth: Payer: Self-pay

## 2022-11-09 NOTE — Telephone Encounter (Signed)
Reviewed results pt is not agreeable to taking medication.  Reports if changes mind will call our office and let us know.

## 2022-11-09 NOTE — Telephone Encounter (Signed)
Patient is returning call to discuss lab results. 

## 2022-12-20 ENCOUNTER — Telehealth: Payer: Self-pay | Admitting: Internal Medicine

## 2022-12-20 ENCOUNTER — Other Ambulatory Visit (HOSPITAL_COMMUNITY): Payer: Self-pay

## 2022-12-20 NOTE — Telephone Encounter (Signed)
Spoke with patient concerning fluctuations in blood pressure, systolic readings per patient ranging from 120-140's. Instructed patient to maintain a blood pressure log (using same monitor, same arm, 2 hours after medication, sitting for a few minutes prior) and to contact our office if systolic pressure consistently elevated over 120's. No further needs at this time

## 2022-12-20 NOTE — Telephone Encounter (Signed)
Patient states that she would like a call back to discuss medication that she was taken off of and medication she has started, but would not give further information. Please advise.

## 2022-12-24 ENCOUNTER — Telehealth: Payer: Self-pay | Admitting: Internal Medicine

## 2022-12-24 ENCOUNTER — Other Ambulatory Visit (HOSPITAL_COMMUNITY): Payer: Self-pay

## 2022-12-24 NOTE — Telephone Encounter (Signed)
Patient wants a call back directly from [facility] regarding a letter.

## 2022-12-24 NOTE — Telephone Encounter (Signed)
Called pt who wants to know if MD can write a letter requesting pt get a motorized Wheelchair. Advised pt to reach out to PCP or PT if she is followed by them.  Pt expresses understanding no further questions at this time.

## 2023-01-07 ENCOUNTER — Other Ambulatory Visit (HOSPITAL_COMMUNITY): Payer: Self-pay

## 2023-01-10 ENCOUNTER — Ambulatory Visit (INDEPENDENT_AMBULATORY_CARE_PROVIDER_SITE_OTHER): Payer: Medicare Other

## 2023-01-10 ENCOUNTER — Telehealth: Payer: Self-pay | Admitting: Internal Medicine

## 2023-01-10 DIAGNOSIS — I5020 Unspecified systolic (congestive) heart failure: Secondary | ICD-10-CM

## 2023-01-10 LAB — CUP PACEART REMOTE DEVICE CHECK
Battery Remaining Longevity: 72 mo
Battery Remaining Percentage: 82 %
Brady Statistic RA Percent Paced: 2 %
Brady Statistic RV Percent Paced: 74 %
Date Time Interrogation Session: 20241205004100
HighPow Impedance: 77 Ohm
Implantable Lead Connection Status: 753985
Implantable Lead Connection Status: 753985
Implantable Lead Connection Status: 753985
Implantable Lead Implant Date: 20190516
Implantable Lead Implant Date: 20190516
Implantable Lead Implant Date: 20190516
Implantable Lead Location: 753858
Implantable Lead Location: 753859
Implantable Lead Location: 753860
Implantable Lead Model: 4671
Implantable Lead Model: 672
Implantable Lead Model: 7740
Implantable Lead Serial Number: 1003218
Implantable Lead Serial Number: 102576
Implantable Lead Serial Number: 818002
Implantable Pulse Generator Implant Date: 20190516
Lead Channel Impedance Value: 442 Ohm
Lead Channel Impedance Value: 689 Ohm
Lead Channel Impedance Value: 700 Ohm
Lead Channel Pacing Threshold Amplitude: 0.3 V
Lead Channel Pacing Threshold Amplitude: 0.7 V
Lead Channel Pacing Threshold Amplitude: 1.7 V
Lead Channel Pacing Threshold Pulse Width: 0.4 ms
Lead Channel Pacing Threshold Pulse Width: 0.4 ms
Lead Channel Pacing Threshold Pulse Width: 0.8 ms
Lead Channel Setting Pacing Amplitude: 2.5 V
Lead Channel Setting Pacing Amplitude: 2.5 V
Lead Channel Setting Pacing Amplitude: 2.5 V
Lead Channel Setting Pacing Pulse Width: 0.4 ms
Lead Channel Setting Pacing Pulse Width: 0.8 ms
Lead Channel Setting Sensing Sensitivity: 0.6 mV
Lead Channel Setting Sensing Sensitivity: 1 mV
Pulse Gen Serial Number: 213687

## 2023-01-10 NOTE — Telephone Encounter (Signed)
Follow Up:     Patient is returning a call from today. 

## 2023-01-10 NOTE — Telephone Encounter (Signed)
Patient states her insurance changing prescriptions plans and he is not sure if her entresto will be covered.  She would like to know if she is not able to get entresto what will be an alternate.

## 2023-01-10 NOTE — Telephone Encounter (Signed)
Left voicemail to return call to office.

## 2023-01-10 NOTE — Telephone Encounter (Signed)
Patient called to talk with Dr. Izora Ribas or nurse

## 2023-01-11 NOTE — Telephone Encounter (Signed)
Left a message to call back.

## 2023-01-14 ENCOUNTER — Other Ambulatory Visit (HOSPITAL_COMMUNITY): Payer: Self-pay

## 2023-01-14 ENCOUNTER — Telehealth: Payer: Self-pay

## 2023-01-14 MED ORDER — ENTRESTO 24-26 MG PO TABS
1.0000 | ORAL_TABLET | Freq: Two times a day (BID) | ORAL | 2 refills | Status: DC
  Filled 2023-01-14 – 2023-03-25 (×3): qty 180, 90d supply, fill #0
  Filled 2023-06-14: qty 180, 90d supply, fill #1
  Filled 2023-09-03 (×2): qty 180, 90d supply, fill #2

## 2023-01-14 NOTE — Telephone Encounter (Signed)
Left a message to call back.

## 2023-01-14 NOTE — Telephone Encounter (Signed)
Spoke with pt reports pharmacy coverage is changing next year and doesn't know if pharmacy will accept discount card from [employer].  Advised pt this can best be addressed after 02/06/23.   Sent a message to pt assistance coordinator.  Was advised that pt qualifies for a grant to cover cost of [place].  Was advised to send a prescription for [name] to have grant in place by the time pt needs med.  Pt would also like to have med delivered to her.  Called pt advised of grant information and that med will be sent to [facility] pharmacy when refill is needed. Answered all questions.

## 2023-01-14 NOTE — Telephone Encounter (Signed)
Patient Advocate Encounter   *Patient not eligible to use manufacturer copay card, but can use a grant secondary with insurance to get medication cost to $0. Spoke with nursing regarding sending script to [facility], set preference to mail/delivery and put grant billing instructions in [place]. Pt got 90 DS on 12/24/22 and currently does not need medication refilled. Script on file for when patient needs to fill again.   The patient was approved for a [employer] grant that will help cover the cost of ENTRESTO Total amount awarded, $10,000.  Effective: 12/15/22 - 12/14/23   WUJ:[id number] NWG:[id number] QMVHQ:[id number] [id number]   Pharmacy provided with approval and processing information. Patient informed via [facility] WITH NURSING   Haze Rushing, CPhT  Pharmacy Patient Advocate Specialist  Direct Number: [phone] Fax: [phone]

## 2023-01-15 ENCOUNTER — Other Ambulatory Visit (HOSPITAL_COMMUNITY): Payer: Self-pay

## 2023-01-29 ENCOUNTER — Other Ambulatory Visit (HOSPITAL_COMMUNITY): Payer: Self-pay

## 2023-02-20 ENCOUNTER — Other Ambulatory Visit (HOSPITAL_COMMUNITY): Payer: Self-pay

## 2023-02-21 ENCOUNTER — Other Ambulatory Visit (HOSPITAL_COMMUNITY): Payer: Self-pay

## 2023-03-11 ENCOUNTER — Other Ambulatory Visit (HOSPITAL_COMMUNITY): Payer: Self-pay

## 2023-03-25 ENCOUNTER — Other Ambulatory Visit: Payer: Self-pay

## 2023-03-25 ENCOUNTER — Other Ambulatory Visit (HOSPITAL_COMMUNITY): Payer: Self-pay

## 2023-03-26 ENCOUNTER — Other Ambulatory Visit: Payer: Self-pay

## 2023-03-26 NOTE — Telephone Encounter (Signed)
 Closing encounter

## 2023-04-09 ENCOUNTER — Telehealth: Payer: Self-pay | Admitting: Internal Medicine

## 2023-04-09 ENCOUNTER — Other Ambulatory Visit (HOSPITAL_COMMUNITY): Payer: Self-pay

## 2023-04-09 ENCOUNTER — Other Ambulatory Visit: Payer: Self-pay | Admitting: Internal Medicine

## 2023-04-09 MED ORDER — BISOPROLOL FUMARATE 5 MG PO TABS
2.5000 mg | ORAL_TABLET | Freq: Every day | ORAL | 2 refills | Status: DC
  Filled 2023-04-09 (×2): qty 45, 90d supply, fill #0
  Filled 2023-07-03: qty 45, 90d supply, fill #1
  Filled 2023-07-05: qty 45, 90d supply, fill #0
  Filled 2023-09-24: qty 45, 90d supply, fill #1

## 2023-04-09 NOTE — Telephone Encounter (Signed)
 Patient c/o Palpitations:  High priority if patient c/o lightheadedness, shortness of breath, or chest pain  How long have you had palpitations/irregular HR/ Afib? Are you having the symptoms now? Started over weekend  Are you currently experiencing lightheadedness, SOB or CP? No  Do you have a history of afib (atrial fibrillation) or irregular heart rhythm? Yes  Have you checked your BP or HR? (document readings if available): 128/70 80 HR  Are you experiencing any other symptoms? Yesterday felt "a little dizzy"

## 2023-04-09 NOTE — Telephone Encounter (Signed)
 Called pt reports palpitations started over the weekend. Has not had bisoprolol in over a week.  Reports ran out and didn't have refills at the pharmacy.   Pt reports did not notice palpitations as much while on bisoprolol.  Reports HR are normal.  Drinks about 75 oz of fluids daily. Advised pt I have sent in a refill of bisoprolol to [facility] outpatient pharmacy.  Pt thanked me for refilling med.  Pt will call back in if palpitations continue once back on bisoprolol.

## 2023-04-11 ENCOUNTER — Other Ambulatory Visit (HOSPITAL_COMMUNITY): Payer: Self-pay

## 2023-04-11 ENCOUNTER — Ambulatory Visit (INDEPENDENT_AMBULATORY_CARE_PROVIDER_SITE_OTHER): Payer: Medicare Other

## 2023-04-11 DIAGNOSIS — I25118 Atherosclerotic heart disease of native coronary artery with other forms of angina pectoris: Secondary | ICD-10-CM

## 2023-04-13 LAB — CUP PACEART REMOTE DEVICE CHECK
Battery Remaining Longevity: 66 mo
Battery Remaining Percentage: 78 %
Brady Statistic RA Percent Paced: 3 %
Brady Statistic RV Percent Paced: 63 %
Date Time Interrogation Session: 20250306004100
HighPow Impedance: 85 Ohm
Implantable Lead Connection Status: 753985
Implantable Lead Connection Status: 753985
Implantable Lead Connection Status: 753985
Implantable Lead Implant Date: 20190516
Implantable Lead Implant Date: 20190516
Implantable Lead Implant Date: 20190516
Implantable Lead Location: 753858
Implantable Lead Location: 753859
Implantable Lead Location: 753860
Implantable Lead Model: 4671
Implantable Lead Model: 672
Implantable Lead Model: 7740
Implantable Lead Serial Number: 1003218
Implantable Lead Serial Number: 102576
Implantable Lead Serial Number: 818002
Implantable Pulse Generator Implant Date: 20190516
Lead Channel Impedance Value: 475 Ohm
Lead Channel Impedance Value: 712 Ohm
Lead Channel Impedance Value: 740 Ohm
Lead Channel Pacing Threshold Amplitude: 0.4 V
Lead Channel Pacing Threshold Amplitude: 0.7 V
Lead Channel Pacing Threshold Amplitude: 1.6 V
Lead Channel Pacing Threshold Pulse Width: 0.4 ms
Lead Channel Pacing Threshold Pulse Width: 0.4 ms
Lead Channel Pacing Threshold Pulse Width: 0.8 ms
Lead Channel Setting Pacing Amplitude: 2.5 V
Lead Channel Setting Pacing Amplitude: 2.5 V
Lead Channel Setting Pacing Amplitude: 2.5 V
Lead Channel Setting Pacing Pulse Width: 0.4 ms
Lead Channel Setting Pacing Pulse Width: 0.8 ms
Lead Channel Setting Sensing Sensitivity: 0.6 mV
Lead Channel Setting Sensing Sensitivity: 1 mV
Pulse Gen Serial Number: 213687

## 2023-05-17 NOTE — Addendum Note (Signed)
 Addended by: Elease Etienne A on: 05/17/2023 01:20 PM   Modules accepted: Orders

## 2023-05-17 NOTE — Progress Notes (Signed)
 Remote ICD transmission.

## 2023-05-27 ENCOUNTER — Telehealth: Payer: Self-pay | Admitting: Internal Medicine

## 2023-05-27 NOTE — Telephone Encounter (Signed)
 Pt would like to know if she's needing any testing or blood work done before her upcoming appt. Please advise

## 2023-05-28 ENCOUNTER — Other Ambulatory Visit (HOSPITAL_COMMUNITY): Payer: Self-pay

## 2023-05-28 NOTE — Telephone Encounter (Signed)
 Called pt advised has an echo scheduled for 09/26/23.  Pt did not want to treat elevated cholesterol at last check.  Pt expresses still doesn't want to treat.  Will have lab work drawn by PCP in August.   Advised no further testing needed at this time.

## 2023-05-28 NOTE — Telephone Encounter (Signed)
 Patient would like to know if she will need any testing done before her appointment in september

## 2023-06-14 ENCOUNTER — Other Ambulatory Visit (HOSPITAL_COMMUNITY): Payer: Self-pay

## 2023-06-18 ENCOUNTER — Other Ambulatory Visit (HOSPITAL_COMMUNITY): Payer: Self-pay

## 2023-07-03 ENCOUNTER — Other Ambulatory Visit (HOSPITAL_COMMUNITY): Payer: Self-pay

## 2023-07-04 IMAGING — DX DG CHEST 2V
3 series · 3 of 3 positions shown · non-contrast
Comparison: 04/30/2016

CLINICAL DATA: Chronic cough

EXAM:
CHEST - 2 VIEW

[chest pa]
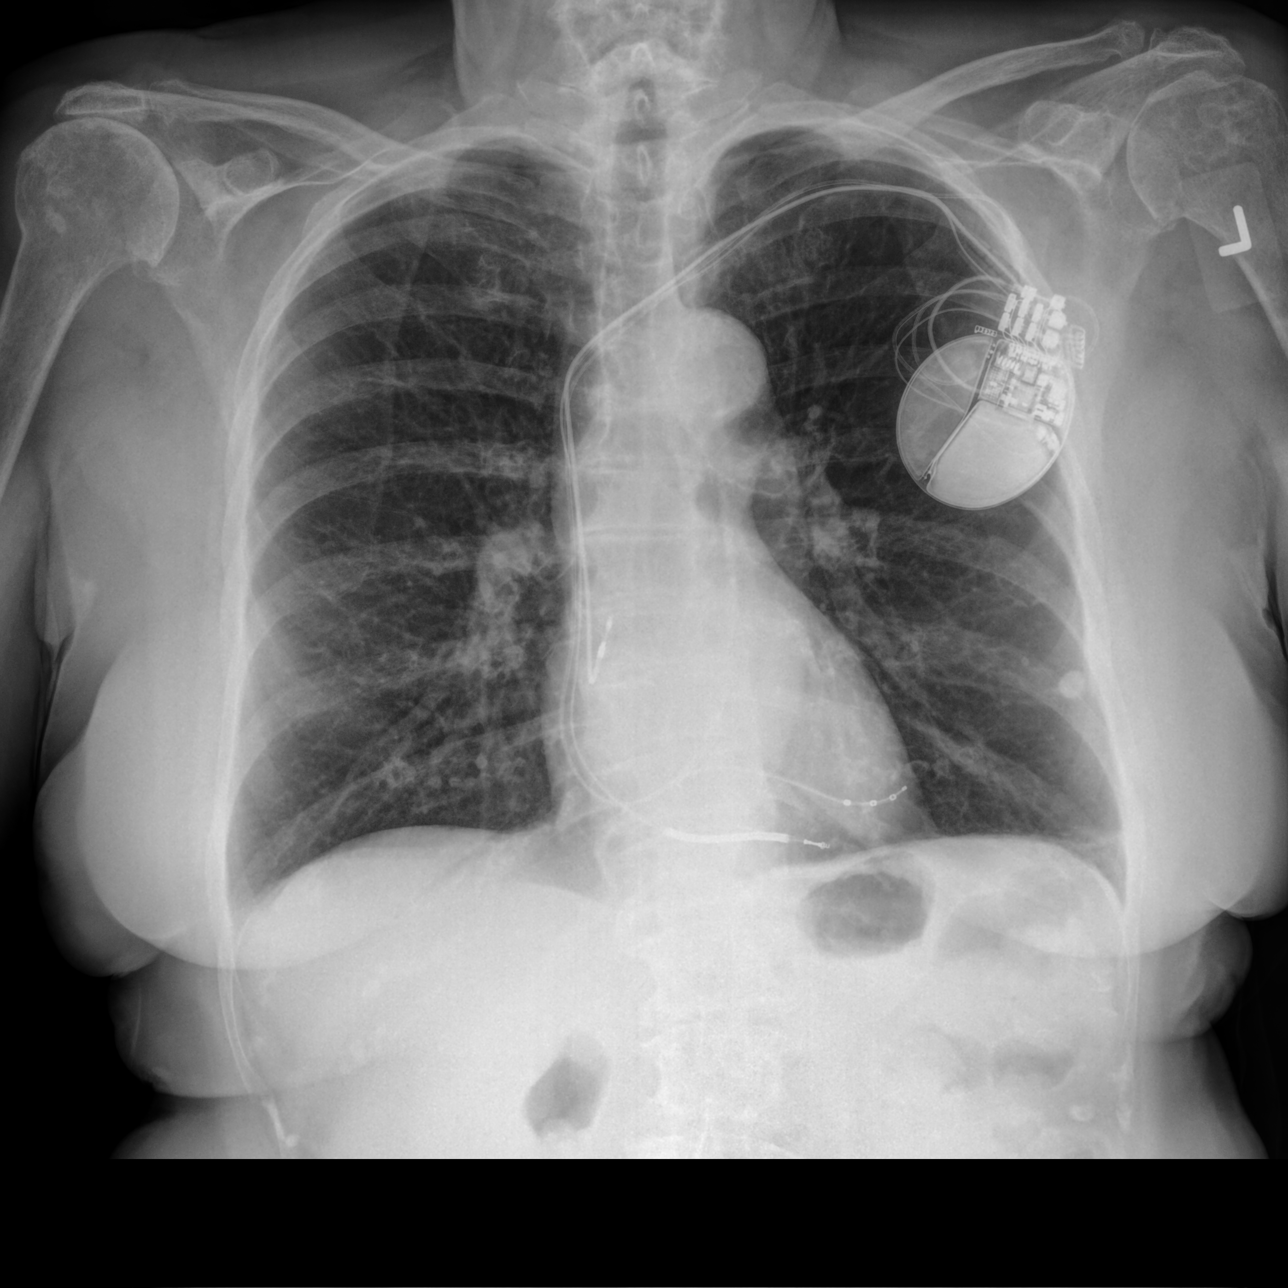

[chest lat (1 of 2)]
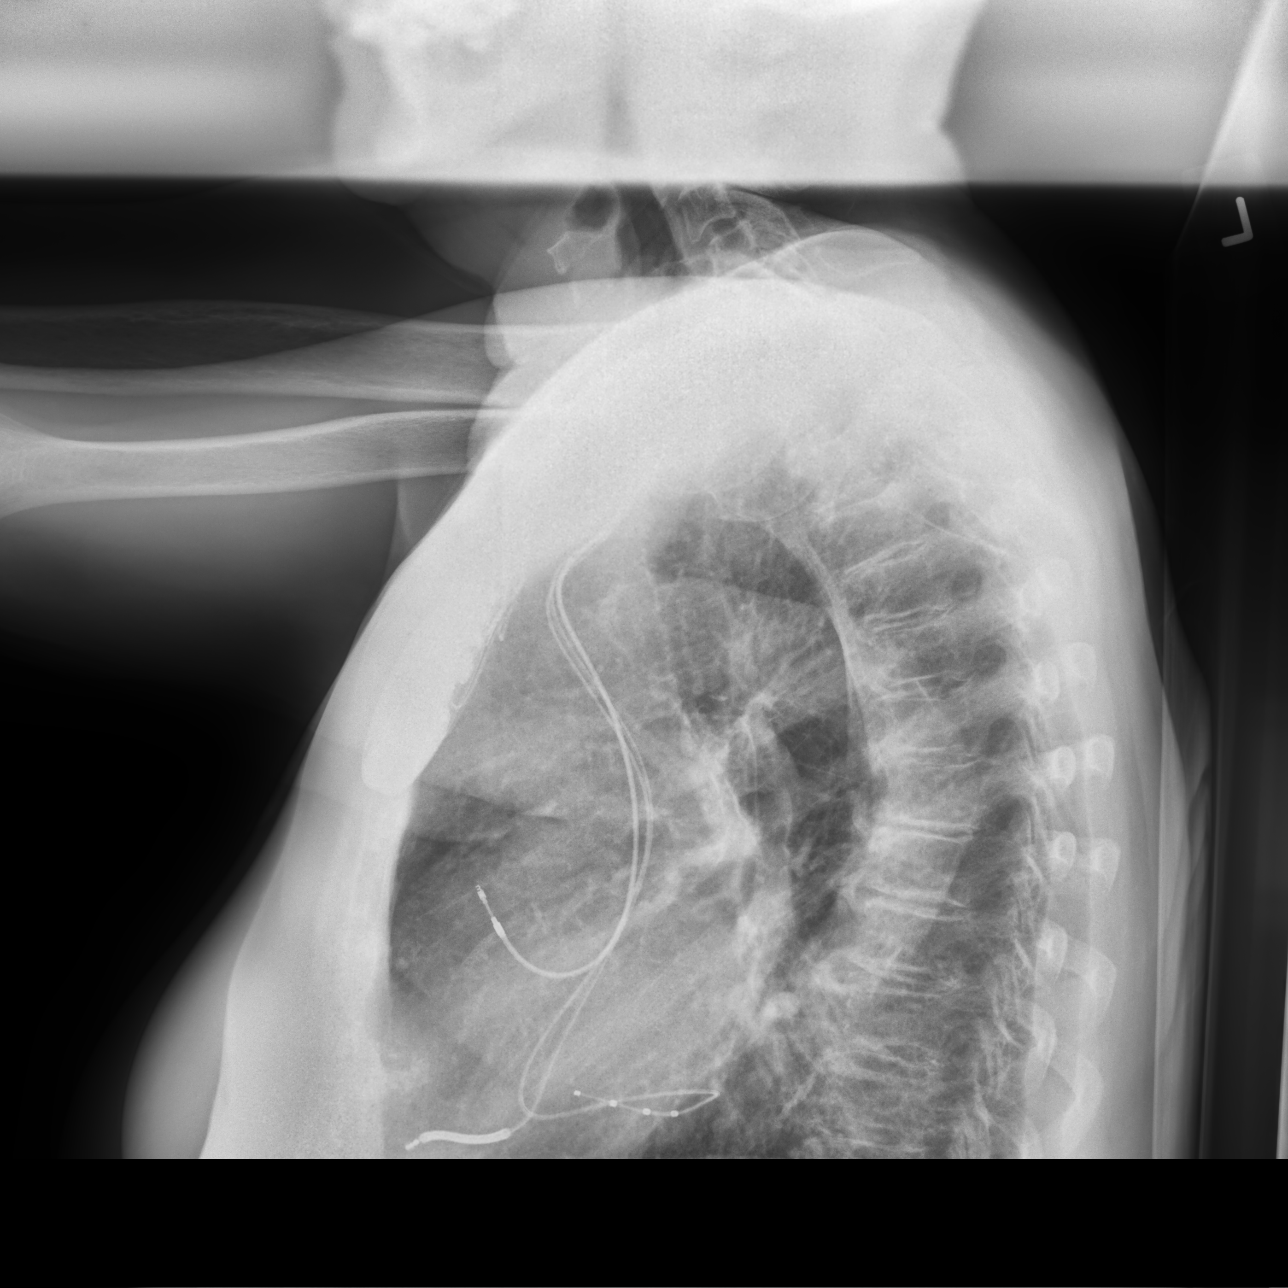

[chest lat (2 of 2)]
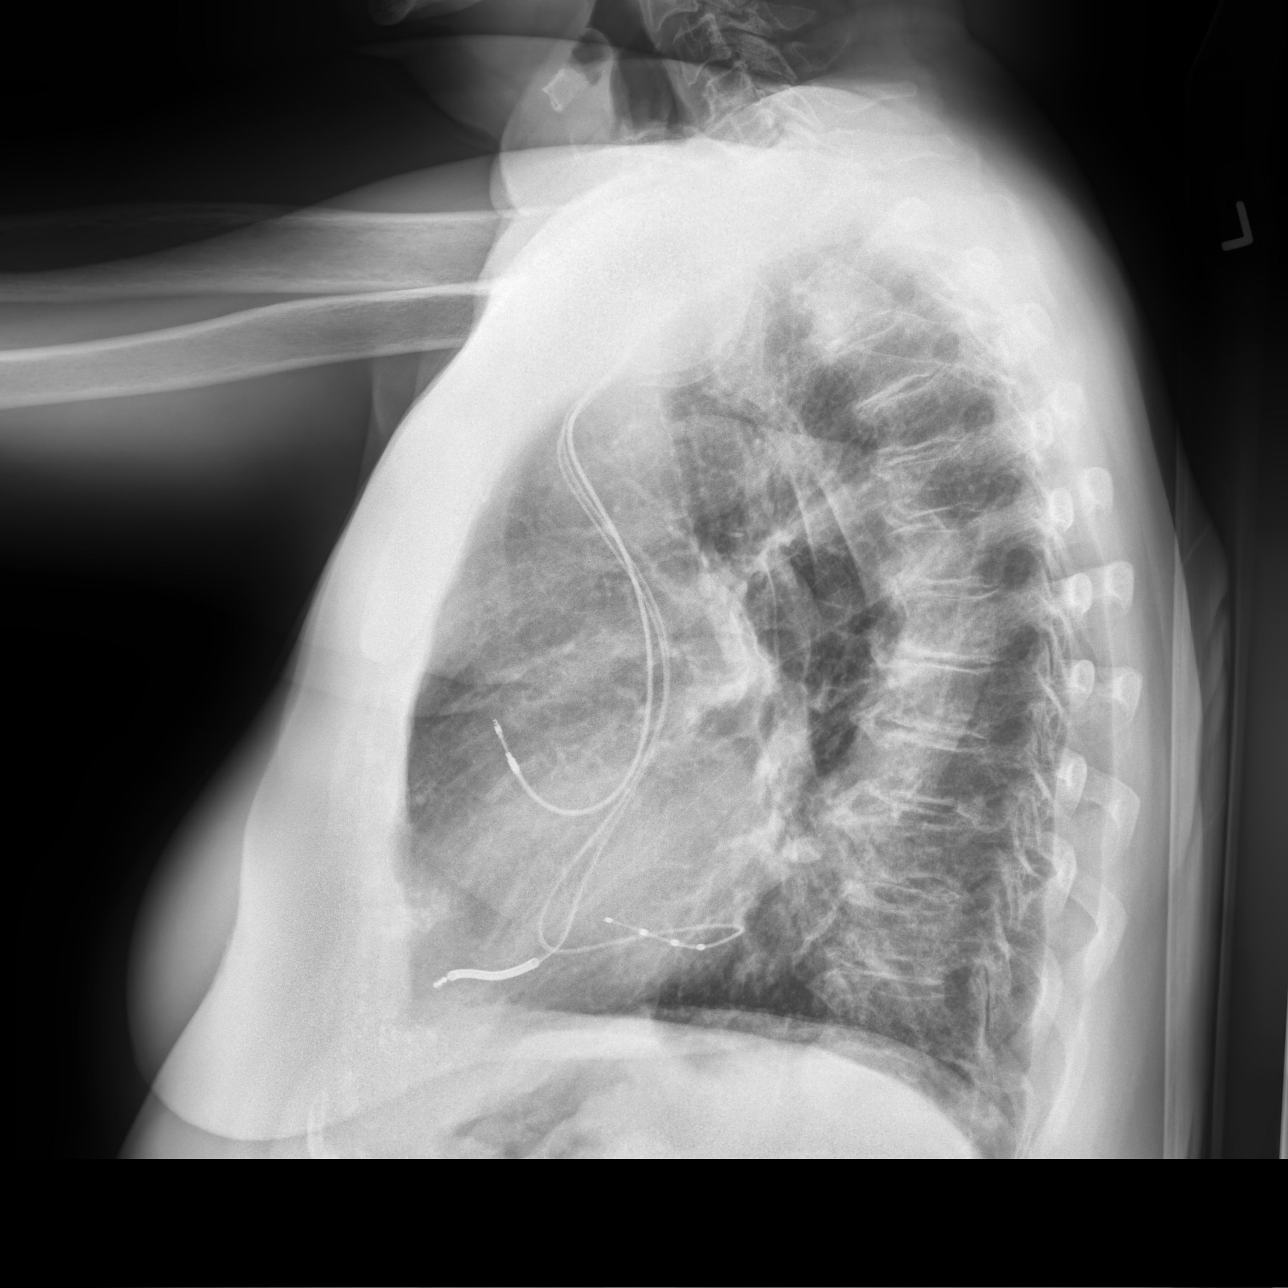

[3 of 3 positions shown; findings below may reference images not displayed]

FINDINGS: Left AICD in place with leads in the right atrium, right ventricle
and coronary sinus. No pneumothorax. Heart is normal size. Calcified
granuloma in the left lower lung. No confluent opacities, effusions
or edema. No acute bony abnormality.
IMPRESSION: No active cardiopulmonary disease.

## 2023-07-05 ENCOUNTER — Other Ambulatory Visit (HOSPITAL_COMMUNITY): Payer: Self-pay

## 2023-07-05 ENCOUNTER — Other Ambulatory Visit: Payer: Self-pay

## 2023-07-11 ENCOUNTER — Ambulatory Visit (INDEPENDENT_AMBULATORY_CARE_PROVIDER_SITE_OTHER): Payer: Medicare Other

## 2023-07-11 DIAGNOSIS — I5020 Unspecified systolic (congestive) heart failure: Secondary | ICD-10-CM | POA: Diagnosis not present

## 2023-07-12 LAB — CUP PACEART REMOTE DEVICE CHECK
Battery Remaining Longevity: 66 mo
Battery Remaining Percentage: 78 %
Brady Statistic RA Percent Paced: 9 %
Brady Statistic RV Percent Paced: 64 %
Date Time Interrogation Session: 20250605004000
HighPow Impedance: 82 Ohm
Implantable Lead Connection Status: 753985
Implantable Lead Connection Status: 753985
Implantable Lead Connection Status: 753985
Implantable Lead Implant Date: 20190516
Implantable Lead Implant Date: 20190516
Implantable Lead Implant Date: 20190516
Implantable Lead Location: 753858
Implantable Lead Location: 753859
Implantable Lead Location: 753860
Implantable Lead Model: 4671
Implantable Lead Model: 672
Implantable Lead Model: 7740
Implantable Lead Serial Number: 1003218
Implantable Lead Serial Number: 102576
Implantable Lead Serial Number: 818002
Implantable Pulse Generator Implant Date: 20190516
Lead Channel Impedance Value: 453 Ohm
Lead Channel Impedance Value: 689 Ohm
Lead Channel Impedance Value: 774 Ohm
Lead Channel Pacing Threshold Amplitude: 0.4 V
Lead Channel Pacing Threshold Amplitude: 0.7 V
Lead Channel Pacing Threshold Amplitude: 1.5 V
Lead Channel Pacing Threshold Pulse Width: 0.4 ms
Lead Channel Pacing Threshold Pulse Width: 0.4 ms
Lead Channel Pacing Threshold Pulse Width: 0.8 ms
Lead Channel Setting Pacing Amplitude: 2.5 V
Lead Channel Setting Pacing Amplitude: 2.5 V
Lead Channel Setting Pacing Amplitude: 2.5 V
Lead Channel Setting Pacing Pulse Width: 0.4 ms
Lead Channel Setting Pacing Pulse Width: 0.8 ms
Lead Channel Setting Sensing Sensitivity: 0.6 mV
Lead Channel Setting Sensing Sensitivity: 1 mV
Pulse Gen Serial Number: 213687

## 2023-07-14 ENCOUNTER — Ambulatory Visit: Payer: Self-pay | Admitting: Internal Medicine

## 2023-07-22 ENCOUNTER — Other Ambulatory Visit (HOSPITAL_COMMUNITY): Payer: Self-pay

## 2023-08-29 NOTE — Addendum Note (Signed)
 Addended by: VICCI SELLER A on: 08/29/2023 09:08 AM   Modules accepted: Orders

## 2023-08-29 NOTE — Progress Notes (Signed)
 Remote ICD transmission.

## 2023-09-03 ENCOUNTER — Other Ambulatory Visit (HOSPITAL_COMMUNITY): Payer: Self-pay

## 2023-09-24 ENCOUNTER — Other Ambulatory Visit (HOSPITAL_COMMUNITY): Payer: Self-pay

## 2023-09-24 ENCOUNTER — Other Ambulatory Visit: Payer: Self-pay | Admitting: Internal Medicine

## 2023-09-26 ENCOUNTER — Other Ambulatory Visit (HOSPITAL_COMMUNITY): Payer: Medicare Other

## 2023-09-26 ENCOUNTER — Ambulatory Visit: Payer: Medicare Other | Admitting: Internal Medicine

## 2023-09-27 ENCOUNTER — Other Ambulatory Visit (HOSPITAL_COMMUNITY): Payer: Self-pay

## 2023-09-27 MED ORDER — SACUBITRIL-VALSARTAN 24-26 MG PO TABS
1.0000 | ORAL_TABLET | Freq: Two times a day (BID) | ORAL | 0 refills | Status: DC
Start: 2023-09-27 — End: 2023-10-09
  Filled 2023-09-27: qty 180, 90d supply, fill #0

## 2023-09-30 NOTE — Progress Notes (Signed)
 HPI  female never smoker followed for OSA. Dr. Roseann follows a lung nodule. Complicated by hypothyroid, restless legs, allergic rhinitis NPSG 12/21/03- AHI 31/ hr, desaturation to 87%, mild PLMS, body weight 135 lbs Normal PFT 2009. Doesn't want cough medicine. Office Spirometry 05/12/2015-normal. FEV1/FVC 0.82  ----------------------------------------------------------------------------------.   09/25/22-  88 year old female never smoker followed for OSA. PCP follows a lung nodule. Complicated by hypothyroid, restless legs, allergic rhinitis Hypothyroid, Fibromyalgia, Hx Asbestos Exposure, CAD/ MI/ LBBB/ AICD, CHF,  - Neb ipratropium,  CPAP auto 5-20/ [employer]    AirSense 11 AutoSet      This machine in Sept, 2022  Download-compliance 100%, AHI 2/ hr Body weight today-133 lbs ------States machine doesn't seem to be using much water. She has called [employer] with not much help Download reviewed. Comfortable with her CPAP. Settings ok. Discussed humidifier. She can adjust for comfort. Cardiology evaluating for palpitations (Has ICD)  10/01/23- 88 year old female never smoker followed for OSA. PCP follows a lung nodule. Complicated by hypothyroid, restless legs, allergic rhinitis Hypothyroid, Fibromyalgia, Hx Asbestos Exposure, CAD/ MI/ LBBB/ AICD, CHF HFrEF,  - Neb ipratropium,  CPAP auto 5-20/ [facility]    AirSense 11 AutoSet      This machine in Sept, 2022  Download-compliance 100%, AHI 9/hr   moderately high leak Body weight today-136 lbs Discussed the use of AI scribe software for clinical note transcription with the patient, who gave verbal consent to proceed.  History of Present Illness   Tiffany Collier is an 88 year old female who presents with issues related to her CPAP machine and mask fit.  She experiences occasional mask leaks with her CPAP machine, particularly with the S11 and S10 models. These leaks sometimes wake her and are noticeable to her partner due to the noise. She uses a  full face mask and finds the leaks bothersome. Despite this, she regularly uses the [employer] model, although the leaks can cause significant pressure increases. Her CPAP machine pressure is set between 5 and 20, with most time spent between 9 and 10.     Assessment and Plan:    Obstructive sleep apnea on positive airway pressure therapy with mask leak Significant mask leak with CPAP may disrupt sleep and affect therapy efficacy. Regular use provides good control. Improving mask fit could enhance outcomes. - Arrange for mask refitting to improve seal and reduce leak.  Chronic cough, possibly medication-related Chronic cough suspected to be related to Entresto . New medication started without significant improvement. Further investigation warranted. - Order chest X-ray to evaluate for other causes of cough.  Presence of cardiac implantable defibrillator (ICD) ICD last checked remotely in June, showing proper function with no arrhythmias. Recent echocardiogram did not include ICD interrogation.      ROS-see HPI   + = positive Constitutional:   + weight loss, night sweats, fevers, chills, fatigue, lassitude. HEENT:   No-  headaches, difficulty swallowing, tooth/dental problems, sore throat,       No-  sneezing, itching, ear ache, nasal congestion, post nasal drip,  CV:  No-   chest pain, orthopnea, PND, swelling in lower extremities, anasarca, dizziness, palpitations Resp: No-   shortness of breath with exertion or at rest.              No-   productive cough,   non-productive cough,  No- coughing up of blood.              No-   change in color of mucus.  No- wheezing.  Skin: No-   rash or lesions. GI:  No-   heartburn, indigestion, abdominal pain, nausea, vomiting, GU:  MS:  No-   joint pain or swelling.   Neuro-     nothing unusual Psych:  No- change in mood or affect. No depression or anxiety.  No memory loss.  OBJ  +wheelchair General- Alert, Oriented, Affect-appropriate, Distress- none  acute, not obese, + wheelchair Skin- rash-none, lesions- none, excoriation- none Lymphadenopathy- none Head- atraumatic            Eyes- Gross vision intact, PERRLA, conjunctivae clear secretions            Ears- Hearing, canals-normal            Nose- Clear, no-Septal dev, mucus, polyps, erosion, perforation             Throat- Mallampati II , mucosa clear/not dry , drainage- none, tonsils- atrophic Neck- flexible , trachea midline, no stridor , thyroid nl, carotid no bruit Chest - symmetrical excursion , unlabored           Heart/CV- RRR , no murmur , no gallop  , no rub, nl s1 s2                           - JVD- none , edema- none, stasis changes- none, varices- none           Lung- + clear, wheeze- none, cough-none, dullness-none, rub- none.            Chest wall- +ICD L chest Abd-  Br/ Gen/ Rectal- Not done, not indicated Extrem- cyanosis- none, clubbing, none, atrophy- none, strength- nl Neuro- grossly intact to observation

## 2023-10-01 ENCOUNTER — Ambulatory Visit (HOSPITAL_COMMUNITY)
Admission: RE | Admit: 2023-10-01 | Discharge: 2023-10-01 | Disposition: A | Discharge status: Home / Self Care | Source: Ambulatory Visit | Attending: Cardiovascular Disease | Admitting: Cardiovascular Disease

## 2023-10-01 ENCOUNTER — Encounter: Payer: Self-pay | Admitting: Internal Medicine

## 2023-10-01 ENCOUNTER — Ambulatory Visit (INDEPENDENT_AMBULATORY_CARE_PROVIDER_SITE_OTHER): Admitting: Internal Medicine

## 2023-10-01 ENCOUNTER — Ambulatory Visit: Payer: Medicare Other | Admitting: Internal Medicine

## 2023-10-01 ENCOUNTER — Ambulatory Visit

## 2023-10-01 VITALS — BP 118/58 | HR 73 | Temp 98.0°F | Ht 66.5 in | Wt 136.4 lb

## 2023-10-01 DIAGNOSIS — I5020 Unspecified systolic (congestive) heart failure: Secondary | ICD-10-CM | POA: Insufficient documentation

## 2023-10-01 DIAGNOSIS — R059 Cough, unspecified: Secondary | ICD-10-CM

## 2023-10-01 DIAGNOSIS — I25118 Atherosclerotic heart disease of native coronary artery with other forms of angina pectoris: Secondary | ICD-10-CM | POA: Diagnosis present

## 2023-10-01 DIAGNOSIS — G4733 Obstructive sleep apnea (adult) (pediatric): Secondary | ICD-10-CM | POA: Diagnosis not present

## 2023-10-01 LAB — ECHOCARDIOGRAM COMPLETE
Area-P 1/2: 4.12 cm2
P 1/2 time: 1493 ms
S' Lateral: 2.8 cm

## 2023-10-01 NOTE — Patient Instructions (Addendum)
 Order- CXR   dx   CXR   dx cough  Order- DME [facility]- please refit CPAP mask for better fit and seal. Continue auto 5-20, supplies, airView

## 2023-10-02 ENCOUNTER — Ambulatory Visit: Admitting: Internal Medicine

## 2023-10-03 ENCOUNTER — Ambulatory Visit: Payer: Self-pay | Admitting: Internal Medicine

## 2023-10-06 ENCOUNTER — Encounter: Payer: Self-pay | Admitting: Internal Medicine

## 2023-10-09 ENCOUNTER — Ambulatory Visit: Attending: Internal Medicine | Admitting: Internal Medicine

## 2023-10-09 ENCOUNTER — Other Ambulatory Visit (HOSPITAL_COMMUNITY): Payer: Self-pay

## 2023-10-09 VITALS — BP 154/78 | HR 2 | Ht 64.5 in | Wt 140.0 lb

## 2023-10-09 DIAGNOSIS — I517 Cardiomegaly: Secondary | ICD-10-CM | POA: Insufficient documentation

## 2023-10-09 DIAGNOSIS — M7989 Other specified soft tissue disorders: Secondary | ICD-10-CM | POA: Diagnosis present

## 2023-10-09 DIAGNOSIS — R9431 Abnormal electrocardiogram [ECG] [EKG]: Secondary | ICD-10-CM | POA: Insufficient documentation

## 2023-10-09 DIAGNOSIS — I340 Nonrheumatic mitral (valve) insufficiency: Secondary | ICD-10-CM | POA: Insufficient documentation

## 2023-10-09 DIAGNOSIS — I25118 Atherosclerotic heart disease of native coronary artery with other forms of angina pectoris: Secondary | ICD-10-CM | POA: Insufficient documentation

## 2023-10-09 DIAGNOSIS — I5020 Unspecified systolic (congestive) heart failure: Secondary | ICD-10-CM | POA: Insufficient documentation

## 2023-10-09 MED ORDER — SACUBITRIL-VALSARTAN 24-26 MG PO TABS
1.0000 | ORAL_TABLET | Freq: Two times a day (BID) | ORAL | 3 refills | Status: AC
  Filled 2023-10-09 – 2024-02-21 (×4): qty 180, 90d supply, fill #0
  Filled 2024-02-21: qty 180, 90d supply, fill #1
  Filled 2024-02-22: qty 180, 90d supply, fill #0
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
## 2023-10-09 NOTE — Patient Instructions (Signed)
 Medication Instructions:  Your physician recommends that you continue on your current medications as directed. Please refer to the Current Medication list given to you today.  *If you need a refill on your cardiac medications before your next appointment, please call your pharmacy*  Lab Work: BNP at [facility] on the 1st floor  You will need lab work as a part of Cardiac Amyloid Screening.   If you have labs (blood work) drawn today and your tests are completely normal, you will receive your results only by: MyChart Message (if you have MyChart) OR A paper copy in the mail If you have any lab test that is abnormal or we need to change your treatment, we will call you to review the results.  Testing/Procedures: Your physician has requested that you have Cardiac Amyloid Screening.   Follow-Up: At [facility], you and your health needs are our priority.  As part of our continuing mission to provide you with exceptional heart care, our providers are all part of one team.  This team includes your primary Cardiologist (physician) and Advanced Practice Providers or APPs (Physician Assistants and Nurse Practitioners) who all work together to provide you with the care you need, when you need it.  Your next appointment:   3 month(s)  Provider:   Stanly DELENA Leavens, MD or Glendia Ferrier, PA-C          We recommend signing up for the patient portal called MyChart.  Sign up information is provided on this After Visit Summary.  MyChart is used to connect with patients for Virtual Visits (Telemedicine).  Patients are able to view lab/test results, encounter notes, upcoming appointments, etc.  Non-urgent messages can be sent to your provider as well.   To learn more about what you can do with MyChart, go to [email or web address].

## 2023-10-10 ENCOUNTER — Ambulatory Visit (INDEPENDENT_AMBULATORY_CARE_PROVIDER_SITE_OTHER): Payer: Medicare Other

## 2023-10-10 ENCOUNTER — Ambulatory Visit: Payer: Self-pay | Admitting: Internal Medicine

## 2023-10-10 DIAGNOSIS — I5020 Unspecified systolic (congestive) heart failure: Secondary | ICD-10-CM | POA: Diagnosis not present

## 2023-10-10 LAB — PRO B NATRIURETIC PEPTIDE: NT-Pro BNP: 474 pg/mL (ref 0–738)

## 2023-10-11 ENCOUNTER — Other Ambulatory Visit (HOSPITAL_COMMUNITY): Payer: Self-pay

## 2023-10-11 ENCOUNTER — Telehealth: Payer: Self-pay | Admitting: Internal Medicine

## 2023-10-11 LAB — CUP PACEART REMOTE DEVICE CHECK
Battery Remaining Longevity: 66 mo
Battery Remaining Percentage: 72 %
Brady Statistic RA Percent Paced: 12 %
Brady Statistic RV Percent Paced: 67 %
Date Time Interrogation Session: 20250904004100
HighPow Impedance: 70 Ohm
Implantable Lead Connection Status: 753985
Implantable Lead Connection Status: 753985
Implantable Lead Connection Status: 753985
Implantable Lead Implant Date: 20190516
Implantable Lead Implant Date: 20190516
Implantable Lead Implant Date: 20190516
Implantable Lead Location: 753858
Implantable Lead Location: 753859
Implantable Lead Location: 753860
Implantable Lead Model: 4671
Implantable Lead Model: 672
Implantable Lead Model: 7740
Implantable Lead Serial Number: 1003218
Implantable Lead Serial Number: 102576
Implantable Lead Serial Number: 818002
Implantable Pulse Generator Implant Date: 20190516
Lead Channel Impedance Value: 468 Ohm
Lead Channel Impedance Value: 696 Ohm
Lead Channel Impedance Value: 705 Ohm
Lead Channel Pacing Threshold Amplitude: 0.3 V
Lead Channel Pacing Threshold Amplitude: 0.7 V
Lead Channel Pacing Threshold Amplitude: 1.7 V
Lead Channel Pacing Threshold Pulse Width: 0.4 ms
Lead Channel Pacing Threshold Pulse Width: 0.4 ms
Lead Channel Pacing Threshold Pulse Width: 0.8 ms
Lead Channel Setting Pacing Amplitude: 2.5 V
Lead Channel Setting Pacing Amplitude: 2.5 V
Lead Channel Setting Pacing Amplitude: 2.5 V
Lead Channel Setting Pacing Pulse Width: 0.4 ms
Lead Channel Setting Pacing Pulse Width: 0.8 ms
Lead Channel Setting Sensing Sensitivity: 0.6 mV
Lead Channel Setting Sensing Sensitivity: 1 mV
Pulse Gen Serial Number: 213687

## 2023-10-11 MED ORDER — FUROSEMIDE 20 MG PO TABS
20.0000 mg | ORAL_TABLET | Freq: Every day | ORAL | 11 refills | Status: AC | PRN
  Filled 2023-10-11: qty 30, 30d supply, fill #0

## 2023-10-11 NOTE — Telephone Encounter (Signed)
 Pt called in about refill request. She wanted to ask if Dr. Santo wants to change this medication to someone else first. Please advise.

## 2023-10-11 NOTE — Telephone Encounter (Signed)
*  STAT* If patient is at the pharmacy, call can be transferred to refill team.   1. Which medications need to be refilled? (please list name of each medication and dose if known)   furosemide  (LASIX ) 20 MG tablet   2. Would you like to learn more about the convenience, safety, & potential cost savings by using the [facility]?   3. Are you open to using the [facility] (Type [facility]. ).  4. Which pharmacy/location (including street and city if local pharmacy) is medication to be sent to?  [place] [facility]   5. Do they need a 30 day or 90 day supply?   Patient stated her medication has expired.  Patient has appointment scheduled with Dr. Santo ion 12/3.

## 2023-10-11 NOTE — Telephone Encounter (Signed)
 Stanly DELENA Leavens, MD 10/10/2023  8:06 AM EDT     Testing Results WNL.  No change to diagnostic or therapeutic plan.  WE had discussed increasing diuretic plan if testing has been abnormal.  Cough may be related to her COPD.   Advised patient that Dr. Leavens does not want to make any changes to her medication at this time. Refill has been sent in.

## 2023-10-13 ENCOUNTER — Ambulatory Visit: Payer: Self-pay | Admitting: Internal Medicine

## 2023-10-13 NOTE — Progress Notes (Unsigned)
 Cardiology Office Note:  .   Date:  10/13/2023  ID:  Tiffany Collier, DOB 11-24-1935, MRN [id number] PCP: Cleotilde, [place]  E, PA  [facility] Providers Cardiologist:  Stanly DELENA Leavens, MD Electrophysiologist:  Danelle Birmingham, MD {  History of Present Illness: .   Tiffany Collier is a 88 y.o. female w/PMHx of COPD, sleep apnea Spinal stenosis CAD (PCI to LAD 2018, NM Stressed 2019 EF 40%- Echo 30-35% in 2018) ICM, CHF CRT-D  She saw Dr Leavens 10/09/23, with a few complaints, palpitations, CP located superior chest with concerns her device may have migrated, dizziness, and some low BP, cough asked about coming off some meds Echo with suggestion of poss amyloid > planned for w/u Appeared volume stable Consider lasix  for possible volume vs COPD >> deferred to PCCM  Ultimately Dr. Leavens commented  Testing Results WNL. No change to diagnostic or therapeutic plan. WE had discussed increasing diuretic plan if testing has been abnormal. Cough may be related to her COPD  LVEF by echo 10/01/23 > recovered LVEF    Today's visit is scheduled as an annual device visit ROS:   *** check with Joey given not implanted here *** device only *** volume *** heart logic looks ok, stable *** 100% LV    Device information BSc CRT-D implanted 06/20/2017  Lead does not appear to be involved in advisory  Studies Reviewed: SABRA    EKG done 10/09/23  and reviewed by myself:  SR/VP aced (RBBB morphology, )  DEVICE interrogation done today and reviewed by myself *** Battery and lead measurements are good ***   ECHOCARDIOGRAM COMPLETE 10/01/2023  1. Strain imaging illustrates apical sparing - not specific but cardiac amyloidosis should be considered if not excluded. . Left ventricular ejection fraction, by estimation, is 55 to 60%. Left ventricular ejection fraction by 3D volume is 58 %. The left ventricle has normal function. The left ventricle has no regional wall motion  abnormalities. Left ventricular diastolic parameters are indeterminate. Elevated left atrial pressure. The E/e' is 89. The average left ventricular global longitudinal strain is -13.9 %. The global longitudinal strain is abnormal. 2. Right ventricular systolic function is normal. The right ventricular size is normal. Mildly increased right ventricular wall thickness. There is normal pulmonary artery systolic pressure. 3. There is no evidence of cardiac tamponade. 4. The mitral valve is degenerative. Mild mitral valve regurgitation. No evidence of mitral stenosis. 5. The aortic valve is tricuspid. Aortic valve regurgitation is trivial. Aortic valve sclerosis is present, with no evidence of aortic valve stenosis.   Comparison(s): A prior study was performed on 09/25/2022. LVEF improved from 40-45% to 55-60%, mild/moderate MR is now mild.  Risk Assessment/Calculations:    Physical Exam:   VS:  There were no vitals taken for this visit.   Wt Readings from Last 3 Encounters:  10/09/23 140 lb (63.5 kg)  10/01/23 136 lb 6.4 oz (61.9 kg)  10/01/22 132 lb (59.9 kg)    GEN: Well nourished, well developed in no acute distress NECK: No JVD; No carotid bruits CARDIAC: ***RRR, no murmurs, rubs, gallops RESPIRATORY:  *** CTA b/l without rales, wheezing or rhonchi  ABDOMEN: Soft, non-tender, non-distended EXTREMITIES: *** No edema; No deformity   ICD site: *** is stable, no thinning, fluctuation, tethering  ASSESSMENT AND PLAN: .    CRT-D *** intact function *** no programming changes made  As per Dr. Gean cards team 2. CAD 3. ICM 4. HTN 5. HLD     {Are you  ordering a CV Procedure (e.g. stress test, cath, DCCV, TEE, etc)?   Press F2        :[id number]}     Dispo: ***  Signed, Tiffany Macario Arthur, PA-C

## 2023-10-14 ENCOUNTER — Ambulatory Visit: Payer: Self-pay | Admitting: Internal Medicine

## 2023-10-14 ENCOUNTER — Encounter: Payer: Self-pay | Admitting: Physician Assistant

## 2023-10-14 ENCOUNTER — Ambulatory Visit: Attending: Physician Assistant | Admitting: Physician Assistant

## 2023-10-14 VITALS — BP 120/58 | HR 72 | Ht 64.5 in | Wt 136.0 lb

## 2023-10-14 DIAGNOSIS — Z95810 Presence of automatic (implantable) cardiac defibrillator: Secondary | ICD-10-CM | POA: Insufficient documentation

## 2023-10-14 DIAGNOSIS — I5020 Unspecified systolic (congestive) heart failure: Secondary | ICD-10-CM | POA: Diagnosis not present

## 2023-10-14 DIAGNOSIS — I255 Ischemic cardiomyopathy: Secondary | ICD-10-CM | POA: Insufficient documentation

## 2023-10-14 LAB — CUP PACEART INCLINIC DEVICE CHECK
Date Time Interrogation Session: 20250908191239
HighPow Impedance: 83 Ohm
Implantable Lead Connection Status: 753985
Implantable Lead Connection Status: 753985
Implantable Lead Connection Status: 753985
Implantable Lead Implant Date: 20190516
Implantable Lead Implant Date: 20190516
Implantable Lead Implant Date: 20190516
Implantable Lead Location: 753858
Implantable Lead Location: 753859
Implantable Lead Location: 753860
Implantable Lead Model: 4671
Implantable Lead Model: 672
Implantable Lead Model: 7740
Implantable Lead Serial Number: 1003218
Implantable Lead Serial Number: 102576
Implantable Lead Serial Number: 818002
Implantable Pulse Generator Implant Date: 20190516
Lead Channel Impedance Value: 489 Ohm
Lead Channel Impedance Value: 707 Ohm
Lead Channel Impedance Value: 720 Ohm
Lead Channel Pacing Threshold Amplitude: 0.5 V
Lead Channel Pacing Threshold Amplitude: 0.6 V
Lead Channel Pacing Threshold Amplitude: 1.5 V
Lead Channel Pacing Threshold Pulse Width: 0.4 ms
Lead Channel Pacing Threshold Pulse Width: 0.4 ms
Lead Channel Pacing Threshold Pulse Width: 0.8 ms
Lead Channel Sensing Intrinsic Amplitude: 20.2 mV
Lead Channel Sensing Intrinsic Amplitude: 4.3 mV
Lead Channel Sensing Intrinsic Amplitude: 9 mV
Lead Channel Setting Pacing Amplitude: 2.5 V
Lead Channel Setting Pacing Amplitude: 2.5 V
Lead Channel Setting Pacing Amplitude: 2.5 V
Lead Channel Setting Pacing Pulse Width: 0.4 ms
Lead Channel Setting Pacing Pulse Width: 0.8 ms
Lead Channel Setting Sensing Sensitivity: 0.6 mV
Lead Channel Setting Sensing Sensitivity: 1 mV
Pulse Gen Serial Number: 213687

## 2023-10-14 NOTE — Patient Instructions (Signed)
 Medication Instructions:   Your physician recommends that you continue on your current medications as directed. Please refer to the Current Medication list given to you today.   *If you need a refill on your cardiac medications before your next appointment, please call your pharmacy*    Lab Work: NONE ORDERED  TODAY    If you have labs (blood work) drawn today and your tests are completely normal, you will receive your results only by: MyChart Message (if you have MyChart) OR A paper copy in the mail If you have any lab test that is abnormal or we need to change your treatment, we will call you to review the results.    Testing/Procedures: NONE ORDERED  TODAY   Follow-Up:  At [facility], you and your health needs are our priority.  As part of our continuing mission to provide you with exceptional heart care, our providers are all part of one team.  This team includes your primary Cardiologist (physician) and Advanced Practice Providers or APPs (Physician Assistants and Nurse Practitioners) who all work together to provide you with the care you need, when you need it.   Your next appointment:  1 year(s)  Provider:   Charlies Arthur, PA-C    We recommend signing up for the patient portal called MyChart.  Sign up information is provided on this After Visit Summary.  MyChart is used to connect with patients for Virtual Visits (Telemedicine).  Patients are able to view lab/test results, encounter notes, upcoming appointments, etc.  Non-urgent messages can be sent to your provider as well.   To learn more about what you can do with MyChart, go to [email or web address].   Other Instructions

## 2023-10-15 LAB — UPEP/UIFE/LIGHT CHAINS/TP, 24-HR UR: Kappa/Lambda Ratio,U: 3.97 (ref 1.83–14.26)

## 2023-10-16 ENCOUNTER — Telehealth (HOSPITAL_COMMUNITY): Payer: Self-pay | Admitting: Internal Medicine

## 2023-10-16 ENCOUNTER — Telehealth: Payer: Self-pay | Admitting: Internal Medicine

## 2023-10-16 LAB — UPEP/UIFE/LIGHT CHAINS/TP, 24-HR UR
% BETA, Urine: 0
ALBUMIN, U: 100
ALPHA 1 URINE: 0
ALPHA-2-GLOBULIN, U: 0
Free Lambda Lt Chains,Ur: 6.31 mg/L (ref 1.17–86.46)
GAMMA GLOBULIN URINE: 0
Kappa/Lambda Ratio,U: 3.97 mg/L (ref 1.83–15.21)
NOTE:: 6.31 mg/L (ref 1.17–86.46)
Protein, 24H Urine: <100 mg/(24.h) (ref 30–150)
Protein, Ur: <4 mg/dL

## 2023-10-16 LAB — MULTIPLE MYELOMA PANEL, SERUM
Albumin SerPl Elph-Mcnc: 3.6 g/dL (ref 2.9–4.4)
Albumin/Glob SerPl: 1.5 (ref 0.7–1.7)
Alpha 1: 0.2 g/dL (ref 0.0–0.4)
Alpha2 Glob SerPl Elph-Mcnc: 0.6 g/dL (ref 0.4–1.0)
B-Globulin SerPl Elph-Mcnc: 1 g/dL (ref 0.7–1.3)
Gamma Glob SerPl Elph-Mcnc: 0.7 g/dL (ref 0.4–1.8)
Globulin, Total: 2.5 g/dL (ref 2.2–3.9)
IgA/Immunoglobulin A, Serum: 130 mg/dL (ref 64–422)
IgG (Immunoglobin G), Serum: 662 mg/dL (ref 586–1602)
IgM (Immunoglobulin M), Srm: 87 mg/dL (ref 26–217)
Total Protein: 6.1 g/dL (ref 6.0–8.5)

## 2023-10-16 NOTE — Telephone Encounter (Signed)
 Stanly DELENA Leavens, MD 10/10/2023  8:06 AM EDT     Testing Results WNL.  No change to diagnostic or therapeutic plan.  WE had discussed increasing diuretic plan if testing has been abnormal.  Cough may be related to her COPD.   The patient has been notified of the result and verbalized understanding.  All questions (if any) were answered. Destine Zirkle Chauvigne, RN 10/16/2023 12:31 PM

## 2023-10-16 NOTE — Telephone Encounter (Signed)
 I called to schedule the ordered PYP scan and patients son was with patient and they declined to schedule due to MD states that EKG looked good and nothing to worry about. Pt tried to schedule at Checkout and they felt like they were given the run around ... test was not scheduled while patient was there at the checkout.. Order will b removed from the [facility] WQ.SABRA If patient decides to have they will call back. Thank you.

## 2023-10-16 NOTE — Telephone Encounter (Signed)
 Pt would like a c/b regarding results from cardiac amyloid screening done on 9/3. Please advise.

## 2023-10-17 ENCOUNTER — Telehealth: Payer: Self-pay | Admitting: Internal Medicine

## 2023-10-17 ENCOUNTER — Other Ambulatory Visit (HOSPITAL_COMMUNITY): Payer: Self-pay

## 2023-10-17 NOTE — Telephone Encounter (Signed)
 Copied from CRM [id number]. Topic: Clinical - Lab/Test Results >> Oct 17, 2023  9:00 AM Russell PARAS wrote: Reason for CRM:   Pt is requesting a hard copy of her chest x-ray results, performed on 10/01/2023. Verified address on file is correct.   CB#  [phone] [id number]

## 2023-10-17 NOTE — Telephone Encounter (Signed)
 Patient calling to see what the next step would be after she has this test. Please advise

## 2023-10-17 NOTE — Telephone Encounter (Signed)
 Mailed to pt

## 2023-10-17 NOTE — Telephone Encounter (Signed)
 Called patient back about message. Patient stated she is scheduled for Amyloid scan at the end of the month and she wants to know what Dr. Santo wants to do after that. Will forward message to him and his nurse.

## 2023-10-17 NOTE — Telephone Encounter (Signed)
 Called patient back about Dr. Milan advisement. Per Dr. Santo, if her study is normal, we won't make any changes (after lab review I suspect her cough is COPD related not volume related). If she has ATTR-CA on the scan, we will discuss next steps.   Patient verbalized understanding.

## 2023-10-19 NOTE — Progress Notes (Signed)
Remote ICD Transmission.

## 2023-10-22 ENCOUNTER — Other Ambulatory Visit: Payer: Self-pay | Admitting: Internal Medicine

## 2023-10-22 DIAGNOSIS — I517 Cardiomegaly: Secondary | ICD-10-CM

## 2023-10-22 DIAGNOSIS — R9431 Abnormal electrocardiogram [ECG] [EKG]: Secondary | ICD-10-CM

## 2023-10-24 ENCOUNTER — Telehealth (HOSPITAL_COMMUNITY): Payer: Self-pay | Admitting: *Deleted

## 2023-10-24 NOTE — Telephone Encounter (Signed)
 Instructions for amyloid test left on voicemail per dpr.

## 2023-10-31 ENCOUNTER — Ambulatory Visit (HOSPITAL_COMMUNITY)
Admission: RE | Admit: 2023-10-31 | Discharge: 2023-10-31 | Disposition: A | Discharge status: Home / Self Care | Source: Ambulatory Visit | Attending: Cardiology | Admitting: Cardiology

## 2023-10-31 DIAGNOSIS — I517 Cardiomegaly: Secondary | ICD-10-CM

## 2023-10-31 DIAGNOSIS — R9431 Abnormal electrocardiogram [ECG] [EKG]: Secondary | ICD-10-CM

## 2023-10-31 LAB — MYOCARDIAL AMYLOID PLANAR & SPECT: H/CL Ratio: 1.06

## 2023-10-31 MED ORDER — TECHNETIUM TC 99M PYROPHOSPHATE
21.8000 | Freq: Once | INTRAVENOUS | Status: AC
  Administered 2023-10-31: 21.8 via INTRAVENOUS

## 2023-11-06 ENCOUNTER — Telehealth: Payer: Self-pay | Admitting: Internal Medicine

## 2023-11-06 NOTE — Telephone Encounter (Signed)
 Patient stated her MYOCARDIAL AMYLOID/CT test results were good and wants to know if she will still need to keep her 12/5 visit.

## 2023-11-11 NOTE — Telephone Encounter (Signed)
 Called pt advised of MD response.  Pt reports had OV with Pulmonology in Aug 2025 is due for f/u in 1 year.  Also, is current with OV with PCP.  Pt expresses if has changes will contact our office.  Pt had no further concerns.  Dec f/u appointment canceled placed recall for 1 yr.

## 2023-11-26 ENCOUNTER — Other Ambulatory Visit: Payer: Self-pay

## 2023-11-26 ENCOUNTER — Other Ambulatory Visit (HOSPITAL_COMMUNITY): Payer: Self-pay

## 2023-12-23 ENCOUNTER — Other Ambulatory Visit (HOSPITAL_COMMUNITY): Payer: Self-pay

## 2023-12-23 ENCOUNTER — Other Ambulatory Visit: Payer: Self-pay | Admitting: Internal Medicine

## 2023-12-23 DIAGNOSIS — I5020 Unspecified systolic (congestive) heart failure: Secondary | ICD-10-CM

## 2023-12-24 ENCOUNTER — Other Ambulatory Visit (HOSPITAL_COMMUNITY): Payer: Self-pay

## 2023-12-24 ENCOUNTER — Other Ambulatory Visit: Payer: Self-pay

## 2023-12-24 MED ORDER — BISOPROLOL FUMARATE 5 MG PO TABS
2.5000 mg | ORAL_TABLET | Freq: Every day | ORAL | 2 refills | Status: AC
  Filled 2023-12-24: qty 45, 90d supply, fill #0

## 2023-12-25 ENCOUNTER — Other Ambulatory Visit (HOSPITAL_COMMUNITY): Payer: Self-pay

## 2023-12-27 ENCOUNTER — Other Ambulatory Visit: Payer: Self-pay

## 2024-01-08 ENCOUNTER — Ambulatory Visit: Admitting: Internal Medicine

## 2024-01-09 ENCOUNTER — Ambulatory Visit

## 2024-01-10 ENCOUNTER — Ambulatory Visit: Admitting: Internal Medicine

## 2024-01-13 LAB — CUP PACEART REMOTE DEVICE CHECK
Battery Remaining Longevity: 66 mo
Battery Remaining Percentage: 72 %
Brady Statistic RA Percent Paced: 17 %
Brady Statistic RV Percent Paced: 73 %
Date Time Interrogation Session: 20251205112100
HighPow Impedance: 77 Ohm
Implantable Lead Connection Status: 753985
Implantable Lead Connection Status: 753985
Implantable Lead Connection Status: 753985
Implantable Lead Implant Date: 20190516
Implantable Lead Implant Date: 20190516
Implantable Lead Implant Date: 20190516
Implantable Lead Location: 753858
Implantable Lead Location: 753859
Implantable Lead Location: 753860
Implantable Lead Model: 4671
Implantable Lead Model: 672
Implantable Lead Model: 7740
Implantable Lead Serial Number: 1003218
Implantable Lead Serial Number: 102576
Implantable Lead Serial Number: 818002
Implantable Pulse Generator Implant Date: 20190516
Lead Channel Impedance Value: 437 Ohm
Lead Channel Impedance Value: 689 Ohm
Lead Channel Impedance Value: 754 Ohm
Lead Channel Pacing Threshold Amplitude: 0.6 V
Lead Channel Pacing Threshold Amplitude: 0.7 V
Lead Channel Pacing Threshold Amplitude: 1.7 V
Lead Channel Pacing Threshold Pulse Width: 0.4 ms
Lead Channel Pacing Threshold Pulse Width: 0.4 ms
Lead Channel Pacing Threshold Pulse Width: 0.8 ms
Lead Channel Setting Pacing Amplitude: 2.5 V
Lead Channel Setting Pacing Amplitude: 2.5 V
Lead Channel Setting Pacing Amplitude: 2.5 V
Lead Channel Setting Pacing Pulse Width: 0.4 ms
Lead Channel Setting Pacing Pulse Width: 0.8 ms
Lead Channel Setting Sensing Sensitivity: 0.6 mV
Lead Channel Setting Sensing Sensitivity: 1 mV
Pulse Gen Serial Number: 213687

## 2024-01-15 ENCOUNTER — Ambulatory Visit: Payer: Self-pay | Admitting: Internal Medicine

## 2024-01-15 NOTE — Progress Notes (Signed)
 Remote ICD Transmission

## 2024-01-20 ENCOUNTER — Telehealth: Payer: Self-pay | Admitting: Internal Medicine

## 2024-01-20 NOTE — Telephone Encounter (Signed)
°  Pt c/o medication issue:  1. Name of Medication: bisoprolol  (ZEBETA ) 5 MG tablet   2. How are you currently taking this medication (dosage and times per day)? Take 0.5 tablets (2.5 mg total) by mouth daily.   3. Are you having a reaction (difficulty breathing--STAT)?    Runny nose, cough, sneezing  4. What is your medication issue?   Runny nose, cough, sneezing which is a side effect. Denies a fever or any other cold like symptoms. She is asking if she can go back on carvedilol .

## 2024-01-20 NOTE — Telephone Encounter (Signed)
 Spoke to patient and she believes that she is have runny nose, cough, and sneezing symptoms due to bisoprolol . Pt advised that I am unaware of this medication causing these symptoms, but will send her message to be reviewed. Pt reports that she has been on bisoprolol  for a year or more and has been experiencing the upper respiratory symptoms for the last several months.

## 2024-01-21 NOTE — Telephone Encounter (Signed)
 Santo Stanly LABOR, MD to [facility] Triage  [facility] (Selected Message) [facility]    01/20/24  8:04 PM Agree with you Lyle- that the timeline is less consistent with this.  I would hate if she stopped a medication to help her and then had a non iatrogenic cause.  What did her PCP think of her rhinorrhea?  S/w the patient and went over the information above. She verbalized understanding. She will try switching her medication to the evening to see if that makes a difference. She will also speak with her PCP, maybe see about allergy meds? Informed her that if this does not help, then to let us  know. She verbalized understanding.

## 2024-02-21 ENCOUNTER — Other Ambulatory Visit: Payer: Self-pay

## 2024-02-21 ENCOUNTER — Other Ambulatory Visit (HOSPITAL_COMMUNITY): Payer: Self-pay

## 2024-02-22 ENCOUNTER — Other Ambulatory Visit (HOSPITAL_COMMUNITY): Payer: Self-pay

## 2024-02-25 ENCOUNTER — Other Ambulatory Visit (HOSPITAL_COMMUNITY): Payer: Self-pay

## 2024-02-26 ENCOUNTER — Other Ambulatory Visit (HOSPITAL_COMMUNITY): Payer: Self-pay

## 2024-04-09 ENCOUNTER — Ambulatory Visit

## 2024-07-09 ENCOUNTER — Ambulatory Visit

## 2024-10-08 ENCOUNTER — Ambulatory Visit

## 2025-01-07 ENCOUNTER — Ambulatory Visit
